# Patient Record
Sex: Female | Born: 1970
Health system: Southern US, Community
[De-identification: ages and names within clinical notes are randomized; demographics above are authoritative.]

## PROBLEM LIST (undated history)

## (undated) DIAGNOSIS — D649 Anemia, unspecified: Secondary | ICD-10-CM

## (undated) DIAGNOSIS — D500 Iron deficiency anemia secondary to blood loss (chronic): Secondary | ICD-10-CM

## (undated) DIAGNOSIS — K5190 Ulcerative colitis, unspecified, without complications: Secondary | ICD-10-CM

## (undated) HISTORY — DX: Iron deficiency anemia secondary to blood loss (chronic): D50.0

## (undated) HISTORY — PX: LAPAROSCOPIC GASTRIC BYPASS: SUR771

## (undated) HISTORY — DX: Ulcerative colitis, unspecified, without complications: K51.90

## (undated) HISTORY — DX: Anemia, unspecified: D64.9

---

## 2009-06-29 ENCOUNTER — Ambulatory Visit: Payer: Self-pay | Admitting: Cardiology

## 2011-05-02 ENCOUNTER — Ambulatory Visit: Payer: Self-pay | Admitting: Gastroenterology

## 2013-03-07 ENCOUNTER — Emergency Department: Payer: Self-pay | Admitting: Emergency Medicine

## 2013-03-07 LAB — URINALYSIS, COMPLETE
Bacteria: NONE SEEN
Bilirubin,UR: NEGATIVE
Glucose,UR: NEGATIVE mg/dL (ref 0–75)
Ketone: NEGATIVE
Nitrite: NEGATIVE
Ph: 6 (ref 4.5–8.0)
Protein: 30
RBC,UR: 1 /HPF (ref 0–5)
Specific Gravity: 1.026 (ref 1.003–1.030)
Squamous Epithelial: 2
WBC UR: 1 /HPF (ref 0–5)

## 2013-03-07 LAB — COMPREHENSIVE METABOLIC PANEL
Albumin: 3.4 g/dL (ref 3.4–5.0)
Alkaline Phosphatase: 82 U/L (ref 50–136)
Anion Gap: 4 — ABNORMAL LOW (ref 7–16)
BUN: 10 mg/dL (ref 7–18)
Bilirubin,Total: 0.2 mg/dL (ref 0.2–1.0)
Calcium, Total: 8.7 mg/dL (ref 8.5–10.1)
Chloride: 108 mmol/L — ABNORMAL HIGH (ref 98–107)
Co2: 27 mmol/L (ref 21–32)
Creatinine: 0.83 mg/dL (ref 0.60–1.30)
EGFR (African American): >60
EGFR (Non-African Amer.): >60
Glucose: 100 mg/dL — ABNORMAL HIGH (ref 65–99)
Osmolality: 277 (ref 275–301)
Potassium: 3.7 mmol/L (ref 3.5–5.1)
SGOT(AST): 24 U/L (ref 15–37)
SGPT (ALT): 16 U/L (ref 12–78)
Sodium: 139 mmol/L (ref 136–145)
Total Protein: 8.6 g/dL — ABNORMAL HIGH (ref 6.4–8.2)

## 2013-03-07 LAB — CBC
HCT: 27.9 % — ABNORMAL LOW (ref 35.0–47.0)
HGB: 8.6 g/dL — ABNORMAL LOW (ref 12.0–16.0)
MCH: 20 pg — ABNORMAL LOW (ref 26.0–34.0)
MCHC: 30.8 g/dL — ABNORMAL LOW (ref 32.0–36.0)
MCV: 65 fL — ABNORMAL LOW (ref 80–100)
Platelet: 299 10*3/uL (ref 150–440)
RBC: 4.3 10*6/uL (ref 3.80–5.20)
RDW: 20.7 % — ABNORMAL HIGH (ref 11.5–14.5)
WBC: 4.2 10*3/uL (ref 3.6–11.0)

## 2013-12-09 ENCOUNTER — Ambulatory Visit: Payer: Self-pay | Admitting: Gastroenterology

## 2013-12-31 ENCOUNTER — Encounter (INDEPENDENT_AMBULATORY_CARE_PROVIDER_SITE_OTHER): Payer: Self-pay

## 2013-12-31 ENCOUNTER — Ambulatory Visit (INDEPENDENT_AMBULATORY_CARE_PROVIDER_SITE_OTHER): Payer: BC Managed Care – PPO | Admitting: Gastroenterology

## 2013-12-31 ENCOUNTER — Other Ambulatory Visit: Payer: Self-pay | Admitting: *Deleted

## 2013-12-31 ENCOUNTER — Encounter: Payer: Self-pay | Admitting: Gastroenterology

## 2013-12-31 VITALS — BP 126/79 | HR 70 | Temp 97.0°F | Resp 18 | Ht 64.5 in | Wt 208.4 lb

## 2013-12-31 DIAGNOSIS — D500 Iron deficiency anemia secondary to blood loss (chronic): Secondary | ICD-10-CM

## 2013-12-31 DIAGNOSIS — K219 Gastro-esophageal reflux disease without esophagitis: Secondary | ICD-10-CM

## 2013-12-31 DIAGNOSIS — D649 Anemia, unspecified: Secondary | ICD-10-CM

## 2013-12-31 DIAGNOSIS — R109 Unspecified abdominal pain: Secondary | ICD-10-CM

## 2013-12-31 HISTORY — DX: Iron deficiency anemia secondary to blood loss (chronic): D50.0

## 2013-12-31 LAB — CBC WITH DIFFERENTIAL/PLATELET
Basophils Absolute: 0 10*3/uL (ref 0.0–0.1)
Basophils Relative: 0 % (ref 0–1)
Eosinophils Absolute: 0 10*3/uL (ref 0.0–0.7)
Eosinophils Relative: 1 % (ref 0–5)
HCT: 30.5 % — ABNORMAL LOW (ref 36.0–46.0)
Hemoglobin: 9.1 g/dL — ABNORMAL LOW (ref 12.0–15.0)
Lymphocytes Relative: 37 % (ref 12–46)
Lymphs Abs: 1.7 10*3/uL (ref 0.7–4.0)
MCH: 20.3 pg — ABNORMAL LOW (ref 26.0–34.0)
MCHC: 29.8 g/dL — ABNORMAL LOW (ref 30.0–36.0)
MCV: 67.9 fL — ABNORMAL LOW (ref 78.0–100.0)
Monocytes Absolute: 0.7 10*3/uL (ref 0.1–1.0)
Monocytes Relative: 16 % — ABNORMAL HIGH (ref 3–12)
Neutro Abs: 2.1 10*3/uL (ref 1.7–7.7)
Neutrophils Relative %: 46 % (ref 43–77)
Platelets: 315 10*3/uL (ref 150–400)
RBC: 4.49 MIL/uL (ref 3.87–5.11)
RDW: 19.9 % — ABNORMAL HIGH (ref 11.5–15.5)
WBC: 4.5 10*3/uL (ref 4.0–10.5)

## 2013-12-31 LAB — IRON: Iron: 23 ug/dL — ABNORMAL LOW (ref 42–145)

## 2013-12-31 MED ORDER — LINACLOTIDE 145 MCG PO CAPS: 145.0000 ug | ORAL_CAPSULE | Freq: Every day | ORAL | Status: DC

## 2013-12-31 MED ORDER — OMEPRAZOLE 20 MG PO CPDR: 20.0000 mg | DELAYED_RELEASE_CAPSULE | Freq: Every day | ORAL | Status: DC

## 2013-12-31 MED ORDER — PEG 3350-KCL-NA BICARB-NACL 420 G PO SOLR: 4000.0000 mL | ORAL | Status: DC

## 2013-12-31 NOTE — Patient Instructions (Signed)
Please complete blood work. We will call with the results.  For reflux: start taking Prilosec once daily, 30 minutes before breakfast.  For constipation: a voucher has been provided for Linzess 145 mcg that you can take each morning, 30 minutes prior to breakfast.   We have scheduled a colonoscopy and possible upper endoscopy in the near future with Dr. Darrick Penna.

## 2013-12-31 NOTE — Assessment & Plan Note (Signed)
43 year old female with history of possible ulcerative colitis but denies any prior colonoscopy, diagnosed in very remote past by Dr. Karilyn Cota. Outside records not available at time of visit but have been requested; previously on Asacol per report but hasn't taken in years. Intermittent LLQ discomfort for the last few months but without acute pain at time of visit; some improvement with defecation, and without any concerning signs at time of visit. Low-volume hematochezia but no significant rectal bleeding, diarrhea, or severe abdominal pain currently.  Needs better management of constipation: start Linzess 145 mcg daily Obtain outside records Proceed with colonoscopy with Dr. Darrick Penna in the near future. The risks, benefits, and alternatives have been discussed in detail with the patient. They state understanding and desire to proceed.

## 2013-12-31 NOTE — Progress Notes (Signed)
Primary Care Physician:  No PCP Per Patient Primary Gastroenterologist:  Dr. Darrick Penna   Chief Complaint  Patient presents with  . Ulcerative Colitis    Right Side Pain  . Constipation    HPI:   Candice Ross presents today as a self-referral secondary to self-reported history of ulcerative colitis. Was on Asacol in remote past. Saw Dr. Karilyn Cota in [place] in the remote past. Denies any prior colonoscopy. It is unclear when she was last seen by any GI specialist. Somewhat vague regarding dates and prior work-up.   Notes pain in LLQ, intermittent. Waxes and wanes in intensity. Now better. Onset about 4-5 months ago. Had chills associated with it. Mild hematochezia. Notes chronic constipation. No diarrhea. States will take awhile to have a BM but ok the last few days. Sometimes pain improved after defecation. Mild nausea, doesn't eat very much, chronic, since gastric bypass. No significant changes in weight. No dysphagia. Has pain after eating in LLQ. Notes reflux. Pain will run for a few weeks then ease up.   Was placed on iron, Vit D, and more she can't remember. Stays fatigued. Last blood work about a year ago. Pre-op weight in the 400 range, lowest 150. Now stable in low 200 range.   States she is taking iron, Vitamin D, and some other medications but does not know the specific names or dosages. Pharmacy was contacted, with no prescriptions filled since early 2014. She also tells me that her Hgb was 7 about a year ago. They are "watching it".    Past Medical History  Diagnosis Date  . Ulcerative colitis     POSSIBLE  . Anemia     Past Surgical History  Procedure Laterality Date  . [place] bypass      [place]    No current outpatient prescriptions on file.   No current facility-administered medications for this visit.    Allergies as of 12/31/2013  . (Not on File)    Family History  Problem Relation Age of Onset  . Colon cancer Neg Hx      History   Social History  . Marital Status: Divorced    Spouse Name: N/A    Number of Children: N/A  . 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 Smoking status: Never Smoker   . Smokeless tobacco: Not on file  . Alcohol Use: No  . Drug Use: No  . Sexual Activity: Yes    Birth Control/ Protection: IUD   Other Topics Concern  . Not on file   Social History Narrative  . No narrative on file    Review of Systems: Gen: see HPI CV: Denies chest pain, heart palpitations, peripheral edema, syncope.  Resp: Denies shortness of breath at rest or with exertion. Denies wheezing or cough.  GI: see HPI GU : Denies urinary burning, urinary frequency, urinary hesitancy MS: states legs hurt at night Derm: Denies rash, itching, dry skin Psych: Denies depression, anxiety, memory loss, and confusion Heme: Denies bruising, bleeding, and enlarged lymph nodes.  Physical Exam: BP 126/79  Pulse 70  Temp(Src) 97 F (36.1 C) (Oral)  Resp 18  Ht 5' 4.5" (1.638 m)  Wt 208 lb 6.4 oz (94.53 kg)  BMI 35.23 kg/m2 General:   Alert and oriented. Pleasant and cooperative. Well-nourished and well-developed.  Head:  Normocephalic and atraumatic. Eyes:  Without icterus, sclera clear and conjunctiva pink.  Ears:  Normal auditory acuity. Nose:  No deformity, discharge,  or lesions. Mouth:  No deformity or lesions, oral mucosa pink.  Lungs:  Clear to auscultation bilaterally. No wheezes, rales, or rhonchi. No distress.  Heart:  S1, S2 present without murmurs appreciated.  Abdomen:  +BS, soft, non-tender and non-distended. No HSM noted. No guarding or rebound. No masses appreciated.  Rectal:  Deferred  Msk:  Symmetrical without gross deformities. Normal posture. Extremities:  Without clubbing or edema. Neurologic:  Alert and  oriented x4;  grossly normal neurologically. Skin:  Intact without significant lesions or  rashes. Psych:  Alert and cooperative. Normal mood and affect.

## 2013-12-31 NOTE — Assessment & Plan Note (Signed)
Per patient's report, Hgb "7" a year ago. Need updated CBC, iron, ferritin. Likely multifactorial with known hx of gastric bypass procedure in 2006. Check Vit D, B12 as well due to malabsorptive state. If evidence of IDA, consider EGD at time of colonoscopy; however it would likely be secondary to post-surgical state.   Proceed with possible upper endoscopy in the near future with Dr. Darrick Penna. The risks, benefits, and alternatives have been discussed in detail with patient. They have stated understanding and desire to proceed.

## 2014-01-01 ENCOUNTER — Encounter (HOSPITAL_COMMUNITY): Payer: Self-pay | Admitting: Pharmacy Technician

## 2014-01-01 LAB — VITAMIN B12: Vitamin B-12: 301 pg/mL (ref 211–911)

## 2014-01-01 LAB — VITAMIN D 25 HYDROXY (VIT D DEFICIENCY, FRACTURES): Vit D, 25-Hydroxy: 41 ng/mL (ref 30–89)

## 2014-01-01 LAB — FERRITIN: Ferritin: 4 ng/mL — ABNORMAL LOW (ref 10–291)

## 2014-01-07 NOTE — Progress Notes (Signed)
NO PCP

## 2014-01-13 ENCOUNTER — Encounter (HOSPITAL_COMMUNITY): Payer: Self-pay | Admitting: *Deleted

## 2014-01-13 ENCOUNTER — Other Ambulatory Visit: Payer: Self-pay | Admitting: Gastroenterology

## 2014-01-13 ENCOUNTER — Encounter (HOSPITAL_COMMUNITY)
Admission: RE | Disposition: A | Discharge status: Home / Self Care | Payer: Self-pay | Source: Ambulatory Visit | Attending: Gastroenterology

## 2014-01-13 ENCOUNTER — Ambulatory Visit (HOSPITAL_COMMUNITY)
Admission: RE | Admit: 2014-01-13 | Discharge: 2014-01-13 | Disposition: A | Discharge status: Home / Self Care | Payer: BC Managed Care – PPO | Source: Ambulatory Visit | Attending: Gastroenterology | Admitting: Gastroenterology

## 2014-01-13 DIAGNOSIS — K644 Residual hemorrhoidal skin tags: Secondary | ICD-10-CM | POA: Diagnosis not present

## 2014-01-13 DIAGNOSIS — R1012 Left upper quadrant pain: Secondary | ICD-10-CM

## 2014-01-13 DIAGNOSIS — K259 Gastric ulcer, unspecified as acute or chronic, without hemorrhage or perforation: Secondary | ICD-10-CM | POA: Diagnosis not present

## 2014-01-13 DIAGNOSIS — K219 Gastro-esophageal reflux disease without esophagitis: Secondary | ICD-10-CM

## 2014-01-13 DIAGNOSIS — Q438 Other specified congenital malformations of intestine: Secondary | ICD-10-CM | POA: Insufficient documentation

## 2014-01-13 DIAGNOSIS — D539 Nutritional anemia, unspecified: Secondary | ICD-10-CM

## 2014-01-13 DIAGNOSIS — K2940 Chronic atrophic gastritis without bleeding: Secondary | ICD-10-CM | POA: Insufficient documentation

## 2014-01-13 DIAGNOSIS — R109 Unspecified abdominal pain: Secondary | ICD-10-CM

## 2014-01-13 DIAGNOSIS — D509 Iron deficiency anemia, unspecified: Secondary | ICD-10-CM | POA: Insufficient documentation

## 2014-01-13 DIAGNOSIS — Z6835 Body mass index (BMI) 35.0-35.9, adult: Secondary | ICD-10-CM | POA: Diagnosis not present

## 2014-01-13 DIAGNOSIS — R1032 Left lower quadrant pain: Secondary | ICD-10-CM

## 2014-01-13 DIAGNOSIS — D649 Anemia, unspecified: Secondary | ICD-10-CM

## 2014-01-13 HISTORY — PX: COLONOSCOPY: SHX5424

## 2014-01-13 HISTORY — PX: ESOPHAGOGASTRODUODENOSCOPY: SHX5428

## 2014-01-13 SURGERY — COLONOSCOPY
Anesthesia: Moderate Sedation

## 2014-01-13 MED ORDER — SODIUM CHLORIDE 0.9 % IV SOLN
INTRAVENOUS | Status: DC
  Administered 2014-01-13: 10:00:00 via INTRAVENOUS

## 2014-01-13 MED ORDER — LIDOCAINE VISCOUS 2 % MT SOLN
OROMUCOSAL | Status: AC
  Filled 2014-01-13: qty 15

## 2014-01-13 MED ORDER — LIDOCAINE VISCOUS 2 % MT SOLN
OROMUCOSAL | Status: DC | PRN
  Administered 2014-01-13: 2 mL via OROMUCOSAL

## 2014-01-13 MED ORDER — MEPERIDINE HCL 100 MG/ML IJ SOLN
INTRAMUSCULAR | Status: DC | PRN
  Administered 2014-01-13 (×4): 25 mg via INTRAVENOUS

## 2014-01-13 MED ORDER — MIDAZOLAM HCL 5 MG/5ML IJ SOLN
INTRAMUSCULAR | Status: AC
  Filled 2014-01-13: qty 10

## 2014-01-13 MED ORDER — STERILE WATER FOR IRRIGATION IR SOLN
Status: DC | PRN
  Administered 2014-01-13: 10:00:00

## 2014-01-13 MED ORDER — MIDAZOLAM HCL 5 MG/5ML IJ SOLN
INTRAMUSCULAR | Status: DC | PRN
  Administered 2014-01-13 (×2): 1 mg via INTRAVENOUS
  Administered 2014-01-13 (×3): 2 mg via INTRAVENOUS
  Administered 2014-01-13: 1 mg via INTRAVENOUS

## 2014-01-13 MED ORDER — MEPERIDINE HCL 100 MG/ML IJ SOLN
INTRAMUSCULAR | Status: AC
  Filled 2014-01-13: qty 2

## 2014-01-13 NOTE — Discharge Instructions (Signed)
YOUR ABDOMINAL PAIN IS DUE TO GASTRITIS/CONSTIPATION. YOU HAVE A SMALL ULCER AT YOUR STAPLE LINE. You have EXTERNALhemorrhoids AND STOMACH POLYPS.  I BIOPSIED YOUR STOMACH, COLON, AND RECTUM.    SEE HEMATOLOGY FOR IV IRON.  FOLLOW A LOW FAT/HIGH FIBER DIET. AVOID ITEMS THAT CAUSE BLOATING. SEE INFO BELOW.  START OMEPRAZOLE.  TAKE 30 MINUTES PRIOR TO YOUR FIRST MEAL.  CONTINUE LINZESS. TAKE 30 MINS PRIOR TO FOOD AND IF NO BM EVERY OTHER DAY THEN TAKE WITH FOOD.  AVOID ITEMS THAT TRIGGER GASTRITIS. SEE INFO BELOW  YOUR BIOPSY WILL BE BACK IN 7 DAYS.  FOLLOW UP IN SEP 2015.  NEXT COLONOSCOPY IN 10 YEAR.   ENDOSCOPY Care After Read the instructions outlined below and refer to this sheet in the next week. These discharge instructions provide you with general information on caring for yourself after you leave the hospital. While your treatment has been planned according to the most current medical practices available, unavoidable complications occasionally occur. If you have any problems or questions after discharge, call DR. Josefa Syracuse, [phone].  ACTIVITY  You may resume your regular activity, but move at a slower pace for the next 24 hours.   Take frequent rest periods for the next 24 hours.   Walking will help get rid of the air and reduce the bloated feeling in your belly (abdomen).   No driving for 24 hours (because of the medicine (anesthesia) used during the test).   You may shower.   Do not sign any important legal documents or operate any machinery for 24 hours (because of the anesthesia used during the test).    NUTRITION  Drink plenty of fluids.   You may resume your normal diet as instructed by your doctor.   Begin with a light meal and progress to your normal diet. Heavy or fried foods are harder to digest and may make you feel sick to your stomach (nauseated).   Avoid alcoholic beverages for 24 hours or as instructed.    MEDICATIONS  You may resume your  normal medications.   WHAT YOU CAN EXPECT TODAY  Some feelings of bloating in the abdomen.   Passage of more gas than usual.   Spotting of blood in your stool or on the toilet paper  .  IF YOU HAD POLYPS REMOVED DURING THE ENDOSCOPY:  Eat a soft diet IF YOU HAVE NAUSEA, BLOATING, ABDOMINAL PAIN, OR VOMITING.    FINDING OUT THE RESULTS OF YOUR TEST Not all test results are available during your visit. DR. Darrick Penna WILL CALL YOU WITHIN 7 DAYS OF YOUR PROCEDUE WITH YOUR RESULTS. Do not assume everything is normal if you have not heard from DR. Freeman Borba IN ONE WEEK, CALL HER OFFICE AT [phone].  SEEK IMMEDIATE MEDICAL ATTENTION AND CALL THE OFFICE: [phone] IF:  You have more than a spotting of blood in your stool.   Your belly is swollen (abdominal distention).   You are nauseated or vomiting.   You have a temperature over 101F.   You have abdominal pain or discomfort that is severe or gets worse throughout the day.   Gastritis  Gastritis is an inflammation (the body's way of reacting to injury and/or infection) of the stomach. It is often caused by bacterial (germ) infections. It can also be caused BY ASPIRIN, BC/GOODY POWDER'S, (IBUPROFEN) MOTRIN, OR ALEVE (NAPROXEN), chemicals (including alcohol), SPICY FOODS, and medications. This illness may be associated with generalized malaise (feeling tired, not well), UPPER ABDOMINAL STOMACH cramps, and fever. One  common bacterial cause of gastritis is an organism known as H. Pylori. This can be treated with antibiotics.    High-Fiber Diet A high-fiber diet changes your normal diet to include more whole grains, legumes, fruits, and vegetables. Changes in the diet involve replacing refined carbohydrates with unrefined foods. The calorie level of the diet is essentially unchanged. The Dietary Reference Intake (recommended amount) for adult males is 38 grams per day. For adult females, it is 25 grams per day. Pregnant and lactating  women should consume 28 grams of fiber per day. Fiber is the intact part of a plant that is not broken down during digestion. Functional fiber is fiber that has been isolated from the plant to provide a beneficial effect in the body. PURPOSE  Increase stool bulk.   Ease and regulate bowel movements.   Lower cholesterol.  INDICATIONS THAT YOU NEED MORE FIBER  Constipation and hemorrhoids.   Uncomplicated diverticulosis (intestine condition) and irritable bowel syndrome.   Weight management.   As a protective measure against hardening of the arteries (atherosclerosis), diabetes, and cancer.   DO NOT USE WITH:  Acute diverticulitis (intestine infection).   Partial small bowel obstructions.   Complicated diverticular disease involving bleeding, rupture (perforation), or abscess (boil, furuncle).   Presence of autonomic neuropathy (nerve damage) or gastroparesis (stomach cannot empty itself).    GUIDELINES FOR INCREASING FIBER IN THE DIET  Start adding fiber to the diet slowly. A gradual increase of about 5 more grams (2 slices of whole-wheat bread, 2 servings of most fruits or vegetables, or 1 bowl of high-fiber cereal) per day is best. Too rapid an increase in fiber may result in constipation, flatulence, and bloating.   Drink enough water and fluids to keep your urine clear or pale yellow. Water, juice, or caffeine-free drinks are recommended. Not drinking enough fluid may cause constipation.   Eat a variety of high-fiber foods rather than one type of fiber.   Try to increase your intake of fiber through using high-fiber foods rather than fiber pills or supplements that contain small amounts of fiber.   The goal is to change the types of food eaten. Do not supplement your present diet with high-fiber foods, but replace foods in your present diet.    INCLUDE A VARIETY OF FIBER SOURCES  Replace refined and processed grains with whole grains, canned fruits with fresh fruits,  and incorporate other fiber sources. White rice, white breads, and most bakery goods contain little or no fiber.   Brown whole-grain rice, buckwheat oats, and many fruits and vegetables are all good sources of fiber. These include: broccoli, Brussels sprouts, cabbage, cauliflower, beets, sweet potatoes, white potatoes (skin on), carrots, tomatoes, eggplant, squash, berries, fresh fruits, and dried fruits.   Cereals appear to be the richest source of fiber. Cereal fiber is found in whole grains and bran. Bran is the fiber-rich outer coat of cereal grain, which is largely removed in refining. In whole-grain cereals, the bran remains. In breakfast cereals, the largest amount of fiber is found in those with "bran" in their names. The fiber content is sometimes indicated on the label.   You may need to include additional fruits and vegetables each day.   In baking, for 1 cup white flour, you may use the following substitutions:   1 cup whole-wheat flour minus 2 tablespoons.   1/2 cup white flour plus 1/2 cup whole-wheat flour.   Low-Fat Diet BREADS, CEREALS, PASTA, RICE, DRIED PEAS, AND BEANS These products are  high in carbohydrates and most are low in fat. Therefore, they can be increased in the diet as substitutes for fatty foods. They too, however, contain calories and should not be eaten in excess. Cereals can be eaten for snacks as well as for breakfast.  Include foods that contain fiber (fruits, vegetables, whole grains, and legumes). Research shows that fiber may lower blood cholesterol levels, especially the water-soluble fiber found in fruits, vegetables, oat products, and legumes. FRUITS AND VEGETABLES It is good to eat fruits and vegetables. Besides being sources of fiber, both are rich in vitamins and some minerals. They help you get the daily allowances of these nutrients. Fruits and vegetables can be used for snacks and desserts. MEATS Limit lean meat, chicken, [place], and fish to no  more than 6 ounces per day. Beef, Pork, and Lamb Use lean cuts of beef, pork, and lamb. Lean cuts include:  Extra-lean ground beef.  Arm roast.  Sirloin tip.  Center-cut ham.  Round steak.  Loin chops.  Rump roast.  Tenderloin.  Trim all fat off the outside of meats before cooking. It is not necessary to severely decrease the intake of red meat, but lean choices should be made. Lean meat is rich in protein and contains a highly absorbable form of iron. Premenopausal women, in particular, should avoid reducing lean red meat because this could increase the risk for low red blood cells (iron-deficiency anemia). The organ meats, such as liver, sweetbreads, kidneys, and brain are very rich in cholesterol. They should be limited. Chicken and [place] These are good sources of protein. The fat of poultry can be reduced by removing the skin and underlying fat layers before cooking. Chicken and [place] can be substituted for lean red meat in the diet. Poultry should not be fried or covered with high-fat sauces. Fish and Shellfish Fish is a good source of protein. Shellfish contain cholesterol, but they usually are low in saturated fatty acids. The preparation of fish is important. Like chicken and [place], they should not be fried or covered with high-fat sauces. EGGS Egg whites contain no fat or cholesterol. They can be eaten often. Try 1 to 2 egg whites instead of whole eggs in recipes or use egg substitutes that do not contain yolk. MILK AND DAIRY PRODUCTS Use skim or 1% milk instead of 2% or whole milk. Decrease whole milk, natural, and processed cheeses. Use nonfat or low-fat (2%) cottage cheese or low-fat cheeses made from vegetable oils. Choose nonfat or low-fat (1 to 2%) yogurt. Experiment with evaporated skim milk in recipes that call for heavy cream. Substitute low-fat yogurt or low-fat cottage cheese for sour cream in dips and salad dressings. Have at least 2 servings of low-fat dairy products,  such as 2 glasses of skim (or 1%) milk each day to help get your daily calcium intake.  FATS AND OILS Reduce the total intake of fats, especially saturated fat. Butterfat, lard, and beef fats are high in saturated fat and cholesterol. These should be avoided as much as possible. Vegetable fats do not contain cholesterol, but certain vegetable fats, such as coconut oil, palm oil, and palm kernel oil are very high in saturated fats. These should be limited. These fats are often used in bakery goods, processed foods, popcorn, oils, and nondairy creamers. Vegetable shortenings and some peanut butters contain hydrogenated oils, which are also saturated fats. Read the labels on these foods and check for saturated vegetable oils. Unsaturated vegetable oils and fats do not raise blood  cholesterol. However, they should be limited because they are fats and are high in calories. Total fat should still be limited to 30% of your daily caloric intake. Desirable liquid vegetable oils are corn oil, cottonseed oil, olive oil, canola oil, safflower oil, soybean oil, and sunflower oil. Peanut oil is not as good, but small amounts are acceptable. Buy a heart-healthy tub margarine that has no partially hydrogenated oils in the ingredients. Mayonnaise and salad dressings often are made from unsaturated fats, but they should also be limited because of their high calorie and fat content. Seeds, nuts, peanut butter, olives, and avocados are high in fat, but the fat is mainly the unsaturated type. These foods should be limited mainly to avoid excess calories and fat. OTHER EATING TIPS Snacks  Most sweets should be limited as snacks. They tend to be rich in calories and fats, and their caloric content outweighs their nutritional value. Some good choices in snacks are graham crackers, melba toast, soda crackers, bagels (no egg), English muffins, fruits, and vegetables. These snacks are preferable to snack crackers, [place] fries, and  chips. Popcorn should be air-popped or cooked in small amounts of liquid vegetable oil. Desserts Eat fruit, low-fat yogurt, and fruit ices. AVOID pastries, cake, and cookies. Sherbet, angel food cake, gelatin dessert, frozen low-fat yogurt, or other frozen products that do not contain saturated fat (pure fruit juice bars, frozen ice pops) are also acceptable.  COOKING METHODS Choose those methods that use little or no fat. They include: Poaching.  Braising.  Steaming.  Grilling.  Baking.  Stir-frying.  Broiling.  Microwaving.  Foods can be cooked in a nonstick pan without added fat, or use a nonfat cooking spray in regular cookware. Limit fried foods and avoid frying in saturated fat. Add moisture to lean meats by using water, broth, cooking wines, and other nonfat or low-fat sauces along with the cooking methods mentioned above. Soups and stews should be chilled after cooking. The fat that forms on top after a few hours in the refrigerator should be skimmed off. When preparing meals, avoid using excess salt. Salt can contribute to raising blood pressure in some people. EATING AWAY FROM HOME Order entres, potatoes, and vegetables without sauces or butter. When meat exceeds the size of a deck of cards (3 to 4 ounces), the rest can be taken home for another meal. Choose vegetable or fruit salads and ask for low-calorie salad dressings to be served on the side. Use dressings sparingly. Limit high-fat toppings, such as bacon, crumbled eggs, cheese, sunflower seeds, and olives. Ask for heart-healthy tub margarine instead of butter.  Hemorrhoids Hemorrhoids are dilated (enlarged) veins around the rectum. Sometimes clots will form in the veins. This makes them swollen and painful. These are called thrombosed hemorrhoids. Causes of hemorrhoids include:  Constipation.   Straining to have a bowel movement.   HEAVY LIFTING HOME CARE INSTRUCTIONS  Eat a well balanced diet and drink 6 to 8 glasses  of water every day to avoid constipation. You may also use a bulk laxative.   Avoid straining to have bowel movements.   Keep anal area dry and clean.   Do not use a donut shaped pillow or sit on the toilet for long periods. This increases blood pooling and pain.   Move your bowels when your body has the urge; this will require less straining and will decrease pain and pressure. =

## 2014-01-13 NOTE — Op Note (Signed)
[facility] [address] [place], [id number]   ENDOSCOPY PROCEDURE REPORT  PATIENT: Candice Ross, Candice C.  MR#: [id number] BIRTHDATE: March 31, 1971 , 43  yrs. old GENDER: Female  ENDOSCOPIST: Jonette EvaSandi Fields, MD REFERRED BY:  PROCEDURE DATE: 01/13/2014 PROCEDURE:   EGD w/ biopsy INDICATIONS:abdominal pain in upper left quadrant.   abdominal pain in the lower left quadrant.   Iron deficiency anemia. MEDICATIONS: TCS+ Demerol 25 mg IV and Versed 2 mg IV TOPICAL ANESTHETIC:   Viscous Xylocaine  DESCRIPTION OF PROCEDURE:     Physical exam was performed.  Informed consent was obtained from the patient after explaining the benefits, risks, and alternatives to the procedure.  The patient was connected to the monitor and placed in the left lateral position.  Continuous oxygen was provided by nasal cannula and IV medicine administered through an indwelling cannula.  After administration of sedation, the patients esophagus was intubated and the [id number] ([id number]  endoscope was advanced under direct visualization to the second portion of the duodenum.  The scope was removed slowly by carefully examining the color, texture, anatomy, and integrity of the mucosa on the way out.  The patient was recovered in endoscopy and discharged home in satisfactory condition.   ESOPHAGUS: The mucosa of the esophagus appeared normal.   STOMACH: Non-erosive gastritis (inflammation) was found in the gastric fundus.  Multiple biopsies were performed using cold forceps. Multiple sessile polyps measuring 3-6 mm in size were found in the gastric fundus.  Multiple biopsies was performed using cold forceps.   A small clean-based ulcer with surrounding edema was found at the anastomosis.  Biopsies were taken at the center of the ulcer(2) and at edge of the ulcer(6).   NORMAL SMALL BOWEL. COMPLICATIONS:   None  ENDOSCOPIC IMPRESSION: 1.   LUQ/LLQ PAIN MOST LIKELY DUE TO GASTRITIS/CONSTIPATION. 2.    MILD Non-erosive gastritis 3.   Multiple SMALL GASTRIC polyps 4.   Small ulcer at the anastomosis MOST LIKELY DUE TO ISCHEMIA 5.   PT LIKELY HAD A ROUX-en-Y GASTRO JEJUNOSTOMY  RECOMMENDATIONS: SEE HEMATOLOGY FOR IV IRON. FOLLOW A LOW FAT/HIGH FIBER DIET.  AVOID ITEMS THAT CAUSE BLOATING.  START OMEPRAZOLE.  TAKE 30 MINUTES PRIOR TO YOUR FIRST MEAL. CONTINUE LINZESS.  TAKE 30 MINS PRIOR TO FOOD AND IF NO BM EVERY OTHER DAY THEN TAKE WITH FOOD. AVOID ITEMS THAT TRIGGER GASTRITIS. BIOPSY WILL BE BACK IN 7 DAYS.  FOLLOW UP IN SEP 2015.   REPEAT EXAM:   _______________________________ Rosalie DoctoreSignedJonette Eva:  Sandi Fields, MD 01/13/2014 3:11 PM Instructions Given]

## 2014-01-13 NOTE — Op Note (Signed)
[facility] [address] [place], [id number]   COLONOSCOPY PROCEDURE REPORT  PATIENT: Candice Ross, Candice C.  MR#: [id number] BIRTHDATE: 1971-01-13 , 43  yrs. old GENDER: Female ENDOSCOPIST: Jonette EvaSandi Fields, MD REFERRED BY: PROCEDURE DATE:  01/13/2014 PROCEDURE:   Colonoscopy with biopsy INDICATIONS:abdominal pain in the lower left quadrant and abdominal pain in the upper left quadrant & CONSTIPATION, IRON DEFICIENCY ANEMIA-FERRITIN 4, PSHx: GABS MEDICATIONS: Demerol 75 mg IV and Versed 7 mg IV  DESCRIPTION OF PROCEDURE:    Physical exam was performed.  Informed consent was obtained from the patient after explaining the benefits, risks, and alternatives to procedure.  The patient was connected to monitor and placed in left lateral position. Continuous oxygen was provided by nasal cannula and IV medicine administered through an indwelling cannula.  After administration of sedation and rectal exam, the patients rectum was intubated and the EC-[id number] ([id number]  colonoscope was advanced under direct visualization to the ileum.  The scope was removed slowly by carefully examining the color, texture, anatomy, and integrity mucosa on the way out.  The patient was recovered in endoscopy and discharged home in satisfactory condition.       COLON FINDINGS: The mucosa appeared normal in the terminal ileum.  , A normal appearing cecum, ileocecal valve, and appendiceal orifice were identified.  The ascending, hepatic flexure, transverse, splenic flexure, descending, sigmoid colon and rectum appeared unremarkable.  No polyps or cancers were seen.  Multiple biopsies were performed TO EVALUATE FOR IBD. The mucosa appeared normal in the terminal ileum.  Multiple biopsies were performed TO EVALUATE FOR IBD.  , Moderate sized external hemorrhoids were found.  , and The colon was redundant.  Manual abdominal counter-pressure was used to reach the cecum.  PREP QUALITY: good.  CECAL  W/D TIME: 11 minutes     COMPLICATIONS: None  ENDOSCOPIC IMPRESSION: 1.   Normal mucosa in the terminal ileum 2.   Normal colon EXCEPT L COLON IS REDUNDANT 3.   Moderate sized external hemorrhoids 4.   NO SOURCE FOR ABDOMINAL PAIN OR ANEMIA IDENTIFIED  RECOMMENDATIONS: AWAIT BIOPSY HIGH FIBER DIET DRINK WATER ADD LINZESS OPV SEP 2015       _______________________________ Rosalie DoctoreSignedJonette Eva:  Sandi Fields, MD 01/13/2014 10:43 AM     PATIENT NAME:  Candice Ross, Candice C. MR#: [id number]

## 2014-01-13 NOTE — Progress Notes (Addendum)
REVIEWED. Pt needs hematology referral FOR IV IRON, DX: GASTRIC BYPASS/IRON DEFICIENCY ANEMIA(FERRITIN 4).

## 2014-01-13 NOTE — Progress Notes (Signed)
I have faxed order over to Short Stay

## 2014-01-13 NOTE — H&P (Signed)
  Primary Care Physician:  No PCP Per Patient Primary Gastroenterologist:  Dr. Darrick PennaFields  Pre-Procedure History & Physical: HPI:  Candice ReamerLinda Carol Peatross Ross is a 43 y.o. female [facility].  Past Medical History  Diagnosis Date  . Ulcerative colitis     POSSIBLE  . Anemia     Past Surgical History  Procedure Laterality Date  . Laparoscopic gastric bypass      [place]    Prior to Admission medications   Medication Sig Start Date End Date Taking? Authorizing Provider  Linaclotide (LINZESS) 145 MCG CAPS capsule Take 1 capsule (145 mcg total) by mouth daily. 30 minutes before breakfast 12/31/13  Yes Nira RetortAnna W Sams, NP  omeprazole (PRILOSEC) 20 MG capsule Take 1 capsule (20 mg total) by mouth daily. 30 minutes prior to breakfast for reflux. 12/31/13  Yes Nira RetortAnna W Sams, NP  polyethylene glycol-electrolytes (TRILYTE) 420 G solution Take 4,000 mLs by mouth as directed. 12/31/13  Yes West BaliSandi L Fields, MD    Allergies as of 12/31/2013  . (Not on File)    Family History  Problem Relation Age of Onset  . Colon cancer Neg Hx     History   Social History  . Marital Status: Divorced    Spouse Name: N/A    Number of Children: N/A  . Years of Education: N/A   Occupational History  . Caterer   . Unit Director for [employer]    Social History Main Topics  . Smoking status: Never Smoker   . Smokeless tobacco: Not on file  . Alcohol Use: No  . Drug Use: No  . Sexual Activity: Yes    Birth Control/ Protection: IUD   Other Topics Concern  . Not on file   Social History Narrative  . No narrative on file    Review of Systems: See HPI, otherwise negative ROS   Physical Exam: BP 142/86  Pulse 62  Temp(Src) 98.3 F (36.8 C) (Oral)  Resp 20  Ht 5' 4.5" (1.638 m)  Wt 208 lb (94.348 kg)  BMI 35.16 kg/m2  SpO2 99%  LMP 12/18/2013 General:   Alert,  pleasant and cooperative in NAD Head:  Normocephalic and atraumatic. Neck:  Supple; Lungs:  Clear  throughout to auscultation.    Heart:  Regular rate and rhythm. Abdomen:  Soft, nontender and nondistended. Normal bowel sounds, without guarding, and without rebound.   Neurologic:  Alert and  oriented x4;  grossly normal neurologically.  Impression/Plan:   FEDAABDOMINAL PAIN/DYSPEPSIA.  PLAN: 1. TCS/EGD TODAY

## 2014-01-14 ENCOUNTER — Other Ambulatory Visit: Payer: Self-pay | Admitting: Gastroenterology

## 2014-01-14 DIAGNOSIS — D509 Iron deficiency anemia, unspecified: Secondary | ICD-10-CM

## 2014-01-15 NOTE — Progress Notes (Signed)
Quick Note:  Called and informed pt. ______ 

## 2014-01-15 NOTE — Progress Notes (Signed)
Quick Note:  Please let patient know that iron, ferritin are low. Hgb low. Will be seeing Hematology soon for consideration of IV iron.  Needs to be taking oral iron in the interim due to history of malabsorptive procedure. Take iron 325 mg po BID.  B 12 and Vit D are good. Make sure taking a complete multivitamin daily. Also recommended after gastric bypass: Calcium with Vit D (like Oscal, Caltrate) and sublingual Vit B 12, which both are over the counter.    ______

## 2014-01-17 ENCOUNTER — Encounter (HOSPITAL_COMMUNITY): Payer: Self-pay | Admitting: Gastroenterology

## 2014-01-31 ENCOUNTER — Encounter (HOSPITAL_COMMUNITY): Payer: BC Managed Care – PPO | Attending: Hematology and Oncology

## 2014-01-31 ENCOUNTER — Encounter (HOSPITAL_COMMUNITY): Payer: Self-pay

## 2014-01-31 VITALS — BP 109/58 | HR 75 | Temp 98.3°F | Resp 16 | Ht 64.5 in | Wt 204.6 lb

## 2014-01-31 DIAGNOSIS — K589 Irritable bowel syndrome without diarrhea: Secondary | ICD-10-CM | POA: Insufficient documentation

## 2014-01-31 DIAGNOSIS — K289 Gastrojejunal ulcer, unspecified as acute or chronic, without hemorrhage or perforation: Secondary | ICD-10-CM | POA: Insufficient documentation

## 2014-01-31 DIAGNOSIS — Z6834 Body mass index (BMI) 34.0-34.9, adult: Secondary | ICD-10-CM | POA: Insufficient documentation

## 2014-01-31 DIAGNOSIS — D509 Iron deficiency anemia, unspecified: Secondary | ICD-10-CM | POA: Insufficient documentation

## 2014-01-31 DIAGNOSIS — E538 Deficiency of other specified B group vitamins: Secondary | ICD-10-CM | POA: Insufficient documentation

## 2014-01-31 DIAGNOSIS — Z9884 Bariatric surgery status: Secondary | ICD-10-CM | POA: Insufficient documentation

## 2014-01-31 DIAGNOSIS — R319 Hematuria, unspecified: Secondary | ICD-10-CM

## 2014-01-31 DIAGNOSIS — E669 Obesity, unspecified: Secondary | ICD-10-CM | POA: Insufficient documentation

## 2014-01-31 DIAGNOSIS — R718 Other abnormality of red blood cells: Secondary | ICD-10-CM

## 2014-01-31 DIAGNOSIS — Z79899 Other long term (current) drug therapy: Secondary | ICD-10-CM | POA: Insufficient documentation

## 2014-01-31 DIAGNOSIS — K581 Irritable bowel syndrome with constipation: Secondary | ICD-10-CM

## 2014-01-31 LAB — URINE MICROSCOPIC-ADD ON

## 2014-01-31 LAB — COMPREHENSIVE METABOLIC PANEL
ALT: 9 U/L (ref 0–35)
AST: 20 U/L (ref 0–37)
Albumin: 3.6 g/dL (ref 3.5–5.2)
Alkaline Phosphatase: 60 U/L (ref 39–117)
Anion gap: 10 (ref 5–15)
BUN: 12 mg/dL (ref 6–23)
CO2: 24 mEq/L (ref 19–32)
Calcium: 9.1 mg/dL (ref 8.4–10.5)
Chloride: 107 mEq/L (ref 96–112)
Creatinine, Ser: 0.84 mg/dL (ref 0.50–1.10)
GFR calc Af Amer: >90 mL/min (ref >90)
GFR calc non Af Amer: 83 mL/min — ABNORMAL LOW (ref >90)
Glucose, Bld: 57 mg/dL — ABNORMAL LOW (ref 70–99)
Potassium: 3.8 mEq/L (ref 3.7–5.3)
Sodium: 141 mEq/L (ref 137–147)
Total Bilirubin: 0.3 mg/dL (ref 0.3–1.2)
Total Protein: 8.1 g/dL (ref 6.0–8.3)

## 2014-01-31 LAB — CBC WITH DIFFERENTIAL/PLATELET
Basophils Absolute: 0 10*3/uL (ref 0.0–0.1)
Basophils Relative: 1 % (ref 0–1)
Eosinophils Absolute: 0 10*3/uL (ref 0.0–0.7)
Eosinophils Relative: 1 % (ref 0–5)
HCT: 29.1 % — ABNORMAL LOW (ref 36.0–46.0)
Hemoglobin: 8.7 g/dL — ABNORMAL LOW (ref 12.0–15.0)
Lymphocytes Relative: 42 % (ref 12–46)
Lymphs Abs: 1.4 10*3/uL (ref 0.7–4.0)
MCH: 21.1 pg — ABNORMAL LOW (ref 26.0–34.0)
MCHC: 29.9 g/dL — ABNORMAL LOW (ref 30.0–36.0)
MCV: 70.5 fL — ABNORMAL LOW (ref 78.0–100.0)
Monocytes Absolute: 0.4 10*3/uL (ref 0.1–1.0)
Monocytes Relative: 13 % — ABNORMAL HIGH (ref 3–12)
Neutro Abs: 1.5 10*3/uL — ABNORMAL LOW (ref 1.7–7.7)
Neutrophils Relative %: 44 % (ref 43–77)
Platelets: 275 10*3/uL (ref 150–400)
RBC: 4.13 MIL/uL (ref 3.87–5.11)
RDW: 19.5 % — ABNORMAL HIGH (ref 11.5–15.5)
WBC: 3.4 10*3/uL — ABNORMAL LOW (ref 4.0–10.5)

## 2014-01-31 LAB — URINALYSIS, ROUTINE W REFLEX MICROSCOPIC
Bilirubin Urine: NEGATIVE
Glucose, UA: NEGATIVE mg/dL
Leukocytes, UA: NEGATIVE
Nitrite: NEGATIVE
Protein, ur: NEGATIVE mg/dL
Specific Gravity, Urine: >1.03 — ABNORMAL HIGH (ref 1.005–1.030)
Urobilinogen, UA: 0.2 mg/dL (ref 0.0–1.0)
pH: 5.5 (ref 5.0–8.0)

## 2014-01-31 LAB — RETICULOCYTES
RBC.: 4.13 MIL/uL (ref 3.87–5.11)
Retic Count, Absolute: 24.8 10*3/uL (ref 19.0–186.0)
Retic Ct Pct: 0.6 % (ref 0.4–3.1)

## 2014-01-31 MED ORDER — CIPROFLOXACIN HCL 250 MG PO TABS: 250.0000 mg | ORAL_TABLET | Freq: Two times a day (BID) | ORAL | Status: DC

## 2014-01-31 NOTE — Patient Instructions (Addendum)
[facility] Discharge Instructions  RECOMMENDATIONS MADE BY THE CONSULTANT AND ANY TEST RESULTS WILL BE SENT TO YOUR REFERRING PHYSICIAN.  EXAM FINDINGS BY THE PHYSICIAN TODAY AND SIGNS OR SYMPTOMS TO REPORT TO CLINIC OR PRIMARY PHYSICIAN: Exam and findings as discussed by Dr. Zigmund DanielFormanek.  Will check some labs today and will plan to give you feraheme infusions on 7/14 & 7/21.  Report increased fatigue, increased ice intake, etc.  We will also check a urinalysis today.  MEDICATIONS PRESCRIBED:  Cipro-take as directed.  INSTRUCTIONS/FOLLOW-UP: Follow-up with feraheme infusions on the next 2 Tuesdays and lab and office visit in 6 weeks.  Thank you for choosing [facility] to provide your oncology and hematology care.  To afford each patient quality time with our providers, please arrive at least 15 minutes before your scheduled appointment time.  With your help, our goal is to use those 15 minutes to complete the necessary work-up to ensure our physicians have the information they need to help with your evaluation and healthcare recommendations.    Effective January 1st, 2014, we ask that you re-schedule your appointment with our physicians should you arrive 10 or more minutes late for your appointment.  We strive to give you quality time with our providers, and arriving late affects you and other patients whose appointments are after yours.    Again, thank you for choosing [facility].  Our hope is that these requests will decrease the amount of time that you wait before being seen by our physicians.       _____________________________________________________________  Should you have questions after your visit to [facility], please contact our office at [phone] between the hours of 8:30 a.m. and 4:30 p.m.  Voicemails left after 4:30 p.m. will not be returned until the following business day.  For prescription refill requests, have  your pharmacy contact our office with your prescription refill request.    _______________________________________________________________  We hope that we have given you very good care.  You may receive a patient satisfaction survey in the mail, please complete it and return it as soon as possible.  We value your feedback!  _______________________________________________________________  Have you asked about our STAR program?  STAR stands for Survivorship Training and Rehabilitation, and this is a nationally recognized cancer care program that focuses on survivorship and rehabilitation.  Cancer and cancer treatments may cause problems, such as, pain, making you feel tired and keeping you from doing the things that you need or want to do. Cancer rehabilitation can help. Our goal is to reduce these troubling effects and help you have the best quality of life possible.  You may receive a survey from a nurse that asks questions about your current state of health.  Based on the survey results, all eligible patients will be referred to the [facility] program for an evaluation so we can better serve you!  A frequently asked questions sheet is available upon request.

## 2014-01-31 NOTE — Addendum Note (Signed)
Addended by: Evelena LeydenBURGESS, Rekha Hobbins S on: 01/31/2014 06:31 PM   Modules accepted: Orders

## 2014-01-31 NOTE — Progress Notes (Signed)
Hudson A. Barnet Glasgow, M.D.  NEW PATIENT EVALUATION   Name: Candice Ross [facility] [place] Date: 01/31/2014 MRN: [id number] DOB: 10/29/1970  PCP: No PCP Per Patient   REFERRING PHYSICIAN: No ref. provider Maryan Rued, M.D.  REASON FOR REFERRAL: Iron deficiency     HISTORY OF PRESENT ILLNESS:Candice Ross is a 43 y.o. female who is referred by her gastroenterologist for iron deficiency with demonstrated external hemorrhoids on colonoscopy and evidence of an anastomotic ulcer, status post gastrojejunostomy(gastric bypass) on upper endoscopy. Ferritin done 12/31/2013 was 4 with B12 level of 301 and hemoglobin of 9.1. She has lost approximately 200 pounds since her gastric bypass surgery. There is a history of ulcerative colitis which he takes no medication at this time for which he took as a call in the past. She currently uses proton pump inhibitor therapy for an anastomotic ulcer that was seen at the site of her gastrojejunostomy by EGD. She has a craving for ice along with cracking of the corners of her lips. She denies any all changes. She also denies any diarrhea, melena, hematochezia, but has had hematuria without dysuria or frequency. She denies any cough, shortness of breath, fever, night sweats and did experience a menstrual cycle 2 weeks ago lasting 10 days.   PAST MEDICAL HISTORY:  has a past medical history of Ulcerative colitis and Anemia.     PAST SURGICAL HISTORY: Past Surgical History  Procedure Laterality Date  . Laparoscopic gastric bypass      [place]  . Colonoscopy N/A 01/13/2014    Procedure: COLONOSCOPY;  Surgeon: Danie Binder, MD;  [facility];  Service: Endoscopy;  Laterality: N/A;  10:30  . Esophagogastroduodenoscopy N/A 01/13/2014    Procedure: ESOPHAGOGASTRODUODENOSCOPY (EGD);  Surgeon: Danie Binder, MD;  Location[facility];  Service: Endoscopy;  Laterality: N/A;      CURRENT MEDICATIONS: has a current medication list which includes the following prescription(s): cholecalciferol, cyanocobalamin, linaclotide, multiple vitamin, omeprazole, and ciprofloxacin.   ALLERGIES: Morphine and related   SOCIAL HISTORY:  reports that she has never smoked. She has never used smokeless tobacco. She reports that she does not drink alcohol or use illicit drugs.   FAMILY HISTORY: family history is negative for Colon cancer.    REVIEW OF SYSTEMS:  Other than that discussed above is noncontributory.    PHYSICAL EXAM:  height is 5' 4.5" (1.638 m) and weight is 204 lb 9.6 oz (92.806 kg). Her oral temperature is 98.3 F (36.8 C). Her blood pressure is 109/58 and her pulse is 75. Her respiration is 16.    GENERAL:alert, no distress and comfortable SKIN: skin color, texture, turgor are normal, no rashes or significant lesions EYES: normal, Conjunctiva are pink and non-injected, sclera clear OROPHARYNX:no exudate, no erythema and lips, buccal mucosa, and tongue normal. Angular cheilitis NECK: supple, thyroid normal size, non-tender, without nodularity CHEST: Normal AP diameter with no breast masses. LYMPH:  no palpable lymphadenopathy in the cervical, axillary or inguinal LUNGS: clear to auscultation and percussion with normal breathing effort HEART: regular rate & rhythm and no murmurs ABDOMEN:abdomen soft, non-tender and normal bowel sounds. No distention. No suprapubic tenderness. MUSCULOSKELETALl:no cyanosis of digits, no clubbing or edema  NEURO: alert & oriented x 3 with fluent speech, no focal motor/sensory deficits    LABORATORY DATA:  No visits with results within 30 Day(s) from this visit. Latest known visit with results is:  Office Visit on 12/31/2013  Component Date Value Ref Range Status  . WBC 12/31/2013 4.5  4.0 - 10.5 K/uL Final  . RBC 12/31/2013 4.49  3.87 - 5.11 MIL/uL Final  . Hemoglobin 12/31/2013 9.1* 12.0 - 15.0 g/dL Final  . HCT  12/31/2013 30.5* 36.0 - 46.0 % Final  . MCV 12/31/2013 67.9* 78.0 - 100.0 fL Final  . MCH 12/31/2013 20.3* 26.0 - 34.0 pg Final  . MCHC 12/31/2013 29.8* 30.0 - 36.0 g/dL Final  . RDW 12/31/2013 19.9* 11.5 - 15.5 % Final  . Platelets 12/31/2013 315  150 - 400 K/uL Final  . Neutrophils Relative % 12/31/2013 46  43 - 77 % Final  . Neutro Abs 12/31/2013 2.1  1.7 - 7.7 K/uL Final  . Lymphocytes Relative 12/31/2013 37  12 - 46 % Final  . Lymphs Abs 12/31/2013 1.7  0.7 - 4.0 K/uL Final  . Monocytes Relative 12/31/2013 16* 3 - 12 % Final  . Monocytes Absolute 12/31/2013 0.7  0.1 - 1.0 K/uL Final  . Eosinophils Relative 12/31/2013 1  0 - 5 % Final  . Eosinophils Absolute 12/31/2013 0.0  0.0 - 0.7 K/uL Final  . Basophils Relative 12/31/2013 0  0 - 1 % Final  . Basophils Absolute 12/31/2013 0.0  0.0 - 0.1 K/uL Final  . Smear Review 12/31/2013 Criteria for review not met   Final  . Vit D, 25-Hydroxy 12/31/2013 41  30 - 89 ng/mL Final   Comment: This assay accurately quantifies Vitamin D, which is the sum of the                          25-Hydroxy forms of Vitamin D2 and D3.  Studies have shown that the                          optimum concentration of 25-Hydroxy Vitamin D is 30 ng/mL or higher.                           Concentrations of Vitamin D between 20 and 29 ng/mL are considered to                          be insufficient and concentrations less than 20 ng/mL are considered                          to be deficient for Vitamin D.  . Vitamin B-12 12/31/2013 301  211 - 911 pg/mL Final  . Iron 12/31/2013 23* 42 - 145 ug/dL Final  . Ferritin 12/31/2013 4* 10 - 291 ng/mL Final    Urinalysis No results found for this basename: colorurine,  appearanceur,  labspec,  phurine,  glucoseu,  hgbur,  bilirubinur,  ketonesur,  proteinur,  urobilinogen,  nitrite,  leukocytesur      _0 : No results found.  PATHOLOGY: No pathology. Peripheral smear with microcytic hypochromic RBCs. No target  cells.   IMPRESSION:  #1. Iron deficiency anemia secondary to malabsorption and gastric bypass surgery along with periodic menstrual chronic blood loss. #2. Status post gastric bypass for obesity. #3. Ulcerative colitis, currently on no treatment but with irritable bowel syndrome treated with Linzess. #4. Gastrojejunal anastomotic ulcer, on proton pump inhibitor therapy, no evidence of malignancy. #5. Vitamin B12 deficiency, on supplements. #6. Hematuria, asymptomatic.  PLAN:  #1. IV Feraheme on 02/04/2014 and 02/11/2014. #2. Urinalysis and urine culture. #3. Cipro 200 mg twice a day for 5 days. #4. Followup in 6 weeks with CBC and ferritin.  I appreciate the opportunity sharing in her care.   ,  A, MD 01/31/2014 3:24 PM   DISCLAIMER:  This note was dictated with voice recognition softwre.  Similar sounding words can inadvertently be transcribed inaccurately and may not be corrected upon review.                

## 2014-02-01 LAB — INTRINSIC FACTOR ANTIBODIES: Intrinsic Factor: NEGATIVE

## 2014-02-01 LAB — FERRITIN: Ferritin: 3 ng/mL — ABNORMAL LOW (ref 10–291)

## 2014-02-02 LAB — URINE CULTURE
Colony Count: NO GROWTH
Culture: NO GROWTH

## 2014-02-03 ENCOUNTER — Telehealth (HOSPITAL_COMMUNITY): Payer: Self-pay | Admitting: Hematology and Oncology

## 2014-02-03 LAB — ANTI-PARIETAL ANTIBODY: Parietal Cell Antibody-IgG: NEGATIVE

## 2014-02-03 NOTE — Telephone Encounter (Signed)
[employer] [phone] PER YOLANDA T AUTH IS NOT REQUIRED FOR [id number]

## 2014-02-04 ENCOUNTER — Encounter (HOSPITAL_BASED_OUTPATIENT_CLINIC_OR_DEPARTMENT_OTHER): Payer: BC Managed Care – PPO

## 2014-02-04 VITALS — BP 124/69 | HR 62 | Temp 97.9°F | Resp 16

## 2014-02-04 DIAGNOSIS — D509 Iron deficiency anemia, unspecified: Secondary | ICD-10-CM

## 2014-02-04 LAB — HEMOGLOBINOPATHY EVALUATION
Hemoglobin Other: 0 %
Hgb A2 Quant: 1.8 % — ABNORMAL LOW (ref 2.2–3.2)
Hgb A: 98.2 % — ABNORMAL HIGH (ref 96.8–97.8)
Hgb F Quant: 0 % (ref 0.0–2.0)
Hgb S Quant: 0 %

## 2014-02-04 MED ORDER — SODIUM CHLORIDE 0.9 % IJ SOLN
10.0000 mL | INTRAMUSCULAR | Status: DC | PRN
  Administered 2014-02-04: 10 mL

## 2014-02-04 MED ORDER — SODIUM CHLORIDE 0.9 % IV SOLN
Freq: Once | INTRAVENOUS | Status: AC
  Administered 2014-02-04: 13:00:00 via INTRAVENOUS

## 2014-02-04 MED ORDER — SODIUM CHLORIDE 0.9 % IV SOLN
510.0000 mg | Freq: Once | INTRAVENOUS | Status: AC
  Administered 2014-02-04: 510 mg via INTRAVENOUS
  Filled 2014-02-04: qty 17

## 2014-02-04 NOTE — Progress Notes (Signed)
Tolerated well

## 2014-02-05 ENCOUNTER — Telehealth: Payer: Self-pay | Admitting: Gastroenterology

## 2014-02-05 NOTE — Telephone Encounter (Signed)
Please call pt. Her colon and RECTUM biopsies are normal. SHE HAS AN ULCER AT HER ANASTOMOSIS DUE TO LOW BLOOD SUPPLY. HER stomach Bx shows mild gastritis.    CONTINUE TO SEE HEMATOLOGY FOR IV IRON.  FOLLOW A LOW FAT/HIGH FIBER DIET. AVOID ITEMS THAT CAUSE BLOATING.   CONTINUE OMEPRAZOLE.  TAKE 30 MINUTES PRIOR TO YOUR FIRST MEAL.  CONTINUE LINZESS. TAKE 30 MINS PRIOR TO FOOD AND IF NO BM EVERY OTHER DAY THEN TAKE WITH FOOD.  AVOID ITEMS THAT TRIGGER GASTRITIS.   FOLLOW UP IN SEP 2015 E30 ANEMIA, CONSTIPATION.  NEXT TCS IN 10 YEARS.

## 2014-02-05 NOTE — Telephone Encounter (Signed)
Called and informed pt. Also mailed the info to her.

## 2014-02-06 NOTE — Telephone Encounter (Signed)
Reminder in epic °

## 2014-02-11 ENCOUNTER — Encounter (HOSPITAL_BASED_OUTPATIENT_CLINIC_OR_DEPARTMENT_OTHER): Payer: BC Managed Care – PPO

## 2014-02-11 VITALS — BP 127/76 | HR 55 | Temp 97.9°F | Resp 18

## 2014-02-11 DIAGNOSIS — D509 Iron deficiency anemia, unspecified: Secondary | ICD-10-CM

## 2014-02-11 MED ORDER — SODIUM CHLORIDE 0.9 % IJ SOLN
10.0000 mL | INTRAMUSCULAR | Status: DC | PRN
  Administered 2014-02-11: 10 mL

## 2014-02-11 MED ORDER — SODIUM CHLORIDE 0.9 % IV SOLN
Freq: Once | INTRAVENOUS | Status: AC
  Administered 2014-02-11: 13:00:00 via INTRAVENOUS

## 2014-02-11 MED ORDER — SODIUM CHLORIDE 0.9 % IV SOLN
510.0000 mg | Freq: Once | INTRAVENOUS | Status: AC
  Administered 2014-02-11: 510 mg via INTRAVENOUS
  Filled 2014-02-11: qty 17

## 2014-02-11 NOTE — Progress Notes (Signed)
1410 - Tolerated infusion well; no s/s reaction noted.  In no apparent distress, a&ox4.

## 2014-03-14 ENCOUNTER — Other Ambulatory Visit (HOSPITAL_COMMUNITY): Payer: BC Managed Care – PPO

## 2014-03-14 ENCOUNTER — Ambulatory Visit (HOSPITAL_COMMUNITY): Payer: BC Managed Care – PPO

## 2014-03-17 ENCOUNTER — Other Ambulatory Visit (HOSPITAL_COMMUNITY): Payer: BC Managed Care – PPO

## 2014-03-19 ENCOUNTER — Encounter (HOSPITAL_COMMUNITY): Payer: Self-pay

## 2014-03-19 ENCOUNTER — Encounter (HOSPITAL_COMMUNITY): Payer: BC Managed Care – PPO

## 2014-03-19 ENCOUNTER — Encounter (HOSPITAL_COMMUNITY): Payer: BC Managed Care – PPO | Attending: Hematology and Oncology

## 2014-03-19 VITALS — BP 135/73 | HR 63 | Temp 98.2°F | Resp 18 | Wt 205.0 lb

## 2014-03-19 DIAGNOSIS — D509 Iron deficiency anemia, unspecified: Secondary | ICD-10-CM

## 2014-03-19 DIAGNOSIS — K589 Irritable bowel syndrome without diarrhea: Secondary | ICD-10-CM | POA: Diagnosis present

## 2014-03-19 DIAGNOSIS — Z9884 Bariatric surgery status: Secondary | ICD-10-CM | POA: Insufficient documentation

## 2014-03-19 DIAGNOSIS — K581 Irritable bowel syndrome with constipation: Secondary | ICD-10-CM

## 2014-03-19 DIAGNOSIS — R718 Other abnormality of red blood cells: Secondary | ICD-10-CM | POA: Diagnosis present

## 2014-03-19 LAB — CBC WITH DIFFERENTIAL/PLATELET
Basophils Absolute: 0 10*3/uL (ref 0.0–0.1)
Basophils Relative: 0 % (ref 0–1)
Eosinophils Absolute: 0 10*3/uL (ref 0.0–0.7)
Eosinophils Relative: 0 % (ref 0–5)
HCT: 36.3 % (ref 36.0–46.0)
Hemoglobin: 11.5 g/dL — ABNORMAL LOW (ref 12.0–15.0)
Lymphocytes Relative: 36 % (ref 12–46)
Lymphs Abs: 1.7 10*3/uL (ref 0.7–4.0)
MCH: 26 pg (ref 26.0–34.0)
MCHC: 31.7 g/dL (ref 30.0–36.0)
MCV: 82.1 fL (ref 78.0–100.0)
Monocytes Absolute: 0.4 10*3/uL (ref 0.1–1.0)
Monocytes Relative: 8 % (ref 3–12)
Neutro Abs: 2.6 10*3/uL (ref 1.7–7.7)
Neutrophils Relative %: 56 % (ref 43–77)
Platelets: 187 10*3/uL (ref 150–400)
RBC: 4.42 MIL/uL (ref 3.87–5.11)
WBC: 4.7 10*3/uL (ref 4.0–10.5)

## 2014-03-19 NOTE — Progress Notes (Signed)
[facility]  OFFICE PROGRESS NOTE  No PCP Per Patient No address on file  DIAGNOSIS: Iron deficiency anemia - Plan: CBC with Differential, Ferritin, CANCELED: Ferritin  RBC microcytosis - Plan: CBC with Differential, CANCELED: Ferritin  Irritable bowel syndrome with constipation - Plan: CBC with Differential, CANCELED: Ferritin  Status post gastric bypass for obesity - Plan: CBC with Differential, CANCELED: Ferritin  Chief Complaint  Patient presents with  . Iron deficiency  . Is was gastric bypass with anastomotic ulcer    CURRENT THERAPY: IV Feraheme on 02/04/2014 and 02/11/2014.  INTERVAL HISTORY: Candice Ross 43 y.o. female returns for followup of iron deficiency anemia secondary to malabsorption, status post gastric bypass, following intravenous iron on 02/04/2014 and 02/11/2014. She was found to have an anastomotic ulcer on upper endoscopy and has been as well on chronic proton pump inhibitor therapy.  She continues to menstruate almost on a regular basis with an IUD in place which is appropriately placed. She denies any fever, night sweats, or further craving for ice. She has felt more fatigued of late and is having problems falling asleep working on a 4 AM to 2 PM shift. She denies any hematuria, melena, hematochezia, hemoptysis, or epistaxis.   MEDICAL HISTORY: Past Medical History  Diagnosis Date  . Ulcerative colitis     POSSIBLE  . Anemia     INTERIM HISTORY: has Anemia; Abdominal pain, unspecified site; GERD (gastroesophageal reflux disease); Irritable bowel syndrome with constipation; and Status post gastric bypass for obesity on her problem list.    ALLERGIES:  is allergic to morphine and related.  MEDICATIONS: has a current medication list which includes the following prescription(s): cholecalciferol, ciprofloxacin, cyanocobalamin, linaclotide, multiple vitamin, and omeprazole.  SURGICAL HISTORY:  Past  Surgical History  Procedure Laterality Date  . Laparoscopic gastric bypass      [place]  . Colonoscopy N/A 01/13/2014    Procedure: COLONOSCOPY;  Surgeon: West Bali, MD;  [facility];  Service: Endoscopy;  Laterality: N/A;  10:30  . Esophagogastroduodenoscopy N/A 01/13/2014    Procedure: ESOPHAGOGASTRODUODENOSCOPY (EGD);  Surgeon: West Bali, MD;  Location[facility];  Service: Endoscopy;  Laterality: N/A;    FAMILY HISTORY: family history is negative for Colon cancer.  SOCIAL HISTORY:  reports that she has never smoked. She has never used smokeless tobacco. She reports that she does not drink alcohol or use illicit drugs.  REVIEW OF SYSTEMS:  Other than that discussed above is noncontributory.  PHYSICAL EXAMINATION: ECOG PERFORMANCE STATUS: 1 - Symptomatic but completely ambulatory  Blood pressure 135/73, pulse 63, temperature 98.2 F (36.8 C), temperature source Oral, resp. rate 18, weight 205 lb (92.987 kg).  GENERAL:alert, no distress and comfortable SKIN: skin color, texture, turgor are normal, no rashes or significant lesions EYES: PERLA; Conjunctiva are pink and non-injected, sclera clear SINUSES: No redness or tenderness over maxillary or ethmoid sinuses OROPHARYNX:no exudate, no erythema on lips, buccal mucosa, or tongue. NECK: supple, thyroid normal size, non-tender, without nodularity. No masses CHEST: Normal AP diameter with no breast masses. LYMPH:  no palpable lymphadenopathy in the cervical, axillary or inguinal LUNGS: clear to auscultation and percussion with normal breathing effort HEART: regular rate & rhythm and no murmurs. ABDOMEN:abdomen soft, non-tender and normal bowel sounds. Obese and soft with no organomegaly, ascites, or CVA tenderness. MUSCULOSKELETAL:no cyanosis of digits and no clubbing. Range of motion normal.  NEURO: alert & oriented x  3 with fluent speech, no focal motor/sensory deficits   LABORATORY DATA: IV Feraheme  given on 02/04/2014 and 02/11/2014: Results for SHAWANDA, SIEVERT (MRN [id number]) as of 03/19/2014 17:02  Ref. Range 01/31/2014 15:09 03/19/2014 14:45  Hemoglobin Latest Range: 12.0-15.0 g/dL 8.7 (L) 40.9 (L)     Office Visit on 03/19/2014  Component Date Value Ref Range Status  . WBC 03/19/2014 4.7  4.0 - 10.5 K/uL Final  . RBC 03/19/2014 4.42  3.87 - 5.11 MIL/uL Final  . Hemoglobin 03/19/2014 11.5* 12.0 - 15.0 g/dL Final  . HCT 81/19/1478 36.3  36.0 - 46.0 % Final  . MCV 03/19/2014 82.1  78.0 - 100.0 fL Final  . MCH 03/19/2014 26.0  26.0 - 34.0 pg Final  . MCHC 03/19/2014 31.7  30.0 - 36.0 g/dL Final  . RDW 29/56/2130 NOT CALCULATED  11.5 - 15.5 % Corrected  . Platelets 03/19/2014 187  150 - 400 K/uL Final  . Neutrophils Relative % 03/19/2014 56  43 - 77 % Final  . Lymphocytes Relative 03/19/2014 36  12 - 46 % Final  . Monocytes Relative 03/19/2014 8  3 - 12 % Final  . Eosinophils Relative 03/19/2014 0  0 - 5 % Final  . Basophils Relative 03/19/2014 0  0 - 1 % Final  . Neutro Abs 03/19/2014 2.6  1.7 - 7.7 K/uL Final  . Lymphs Abs 03/19/2014 1.7  0.7 - 4.0 K/uL Final  . Monocytes Absolute 03/19/2014 0.4  0.1 - 1.0 K/uL Final  . Eosinophils Absolute 03/19/2014 0.0  0.0 - 0.7 K/uL Final  . Basophils Absolute 03/19/2014 0.0  0.0 - 0.1 K/uL Final  . RBC Morphology 03/19/2014 SCHISTOCYTES PRESENT (2-5/hpf)   Final   [place]  . Smear Review 03/19/2014 LARGE PLATELETS PRESENT   Final   GIANT PLATELETS SEEN    PATHOLOGY: No new pathology.  Urinalysis    Component Value Date/Time   COLORURINE YELLOW 01/31/2014 1509   APPEARANCEUR CLEAR 01/31/2014 1509   LABSPEC >1.030* 01/31/2014 1509   PHURINE 5.5 01/31/2014 1509   GLUCOSEU NEGATIVE 01/31/2014 1509   HGBUR LARGE* 01/31/2014 1509   BILIRUBINUR NEGATIVE 01/31/2014 1509   KETONESUR TRACE* 01/31/2014 1509   PROTEINUR NEGATIVE 01/31/2014 1509   UROBILINOGEN 0.2 01/31/2014 1509   NITRITE NEGATIVE 01/31/2014 1509   LEUKOCYTESUR  NEGATIVE 01/31/2014 1509    RADIOGRAPHIC STUDIES: No results found.  ASSESSMENT:  #1. Iron deficiency anemia secondary to malabsorption due to previous gastric bypass surgery, continued blood loss via menstruation, and anastomotic ulcer. #2. Ulcerative colitis, currently on no treatment but with irritable bowel syndrome treated with Linzess. #3. Vitamin B12 deficiency, on supplements. #4. Morbid obesity, status post gastric bypass #5. Menometrorrhagia.Marland Kitchen   PLAN:  #1. Followup in 2 months with CBC and ferritin.   All questions were answered. The patient knows to call the clinic with any problems, questions or concerns. We can certainly see the patient much sooner if necessary.   I spent 25 minutes counseling the patient face to face. The total time spent in the appointment was 30 minutes.    Maurilio Lovely, MD 03/19/2014 5:02 PM  DISCLAIMER:  This note was dictated with voice recognition software.  Similar sounding words can inadvertently be transcribed inaccurately and may not be corrected upon review.

## 2014-03-19 NOTE — Patient Instructions (Signed)
[facility] Discharge Instructions  RECOMMENDATIONS MADE BY THE CONSULTANT AND ANY TEST RESULTS WILL BE SENT TO YOUR REFERRING PHYSICIAN.   Return in 2 months for lab work and office visit.   Thank you for choosing [facility] to provide your oncology and hematology care.  To afford each patient quality time with our providers, please arrive at least 15 minutes before your scheduled appointment time.  With your help, our goal is to use those 15 minutes to complete the necessary work-up to ensure our physicians have the information they need to help with your evaluation and healthcare recommendations.    Effective January 1st, 2014, we ask that you re-schedule your appointment with our physicians should you arrive 10 or more minutes late for your appointment.  We strive to give you quality time with our providers, and arriving late affects you and other patients whose appointments are after yours.    Again, thank you for choosing [facility].  Our hope is that these requests will decrease the amount of time that you wait before being seen by our physicians.       _____________________________________________________________  Should you have questions after your visit to [facility], please contact our office at [phone] between the hours of 8:30 a.m. and 4:30 p.m.  Voicemails left after 4:30 p.m. will not be returned until the following business day.  For prescription refill requests, have your pharmacy contact our office with your prescription refill request.    _______________________________________________________________  We hope that we have given you very good care.  You may receive a patient satisfaction survey in the mail, please complete it and return it as soon as possible.  We value your feedback!  _______________________________________________________________  Have you asked about our STAR program?  STAR stands  for Survivorship Training and Rehabilitation, and this is a nationally recognized cancer care program that focuses on survivorship and rehabilitation.  Cancer and cancer treatments may cause problems, such as, pain, making you feel tired and keeping you from doing the things that you need or want to do. Cancer rehabilitation can help. Our goal is to reduce these troubling effects and help you have the best quality of life possible.  You may receive a survey from a nurse that asks questions about your current state of health.  Based on the survey results, all eligible patients will be referred to the STAR program for an evaluation so we can better serve you!  A frequently asked questions sheet is available upon request.  

## 2014-03-19 NOTE — Progress Notes (Signed)
Candice Ross presented for [employer]. Labs per MD order drawn via Peripheral Line 23 gauge needle inserted in left antecubital.  Good blood return present. Procedure without incident.  Needle removed intact. Patient tolerated procedure well.

## 2014-03-21 ENCOUNTER — Encounter (HOSPITAL_COMMUNITY): Payer: BC Managed Care – PPO

## 2014-03-21 DIAGNOSIS — D509 Iron deficiency anemia, unspecified: Secondary | ICD-10-CM | POA: Diagnosis not present

## 2014-03-21 NOTE — Progress Notes (Signed)
Candice Ross presented for [employer]. Labs per MD order drawn via Peripheral Line 23 gauge needle inserted in left AC  Good blood return present. Procedure without incident.  Needle removed intact. Patient tolerated procedure well.  (In for ferritin level - was not done on previous visit.)

## 2014-03-22 LAB — FERRITIN: Ferritin: 36 ng/mL (ref 10–291)

## 2014-03-25 ENCOUNTER — Encounter: Payer: Self-pay | Admitting: Gastroenterology

## 2014-05-19 ENCOUNTER — Other Ambulatory Visit (HOSPITAL_COMMUNITY): Payer: BC Managed Care – PPO

## 2014-05-19 ENCOUNTER — Ambulatory Visit (HOSPITAL_COMMUNITY): Payer: BC Managed Care – PPO

## 2014-05-20 NOTE — Progress Notes (Signed)
This encounter was created in error - please disregard.

## 2014-05-29 ENCOUNTER — Encounter (HOSPITAL_COMMUNITY): Payer: Self-pay

## 2014-09-23 ENCOUNTER — Other Ambulatory Visit (HOSPITAL_COMMUNITY): Payer: Self-pay

## 2014-09-23 DIAGNOSIS — D649 Anemia, unspecified: Secondary | ICD-10-CM

## 2014-09-25 ENCOUNTER — Encounter (HOSPITAL_COMMUNITY): Payer: BLUE CROSS/BLUE SHIELD | Attending: Hematology & Oncology | Admitting: Hematology & Oncology

## 2014-09-25 ENCOUNTER — Encounter (HOSPITAL_BASED_OUTPATIENT_CLINIC_OR_DEPARTMENT_OTHER): Payer: BLUE CROSS/BLUE SHIELD

## 2014-09-25 ENCOUNTER — Encounter (HOSPITAL_COMMUNITY): Payer: Self-pay | Admitting: Hematology & Oncology

## 2014-09-25 VITALS — BP 130/78 | HR 66 | Temp 97.8°F | Resp 16 | Wt 197.2 lb

## 2014-09-25 DIAGNOSIS — K289 Gastrojejunal ulcer, unspecified as acute or chronic, without hemorrhage or perforation: Secondary | ICD-10-CM | POA: Diagnosis not present

## 2014-09-25 DIAGNOSIS — D509 Iron deficiency anemia, unspecified: Secondary | ICD-10-CM | POA: Insufficient documentation

## 2014-09-25 DIAGNOSIS — Z9884 Bariatric surgery status: Secondary | ICD-10-CM

## 2014-09-25 DIAGNOSIS — K909 Intestinal malabsorption, unspecified: Secondary | ICD-10-CM

## 2014-09-25 DIAGNOSIS — D649 Anemia, unspecified: Secondary | ICD-10-CM

## 2014-09-25 LAB — CBC WITH DIFFERENTIAL/PLATELET
Basophils Absolute: 0 10*3/uL (ref 0.0–0.1)
Basophils Relative: 0 % (ref 0–1)
Eosinophils Absolute: 0 10*3/uL (ref 0.0–0.7)
Eosinophils Relative: 1 % (ref 0–5)
HCT: 37.2 % (ref 36.0–46.0)
Hemoglobin: 12 g/dL (ref 12.0–15.0)
Lymphocytes Relative: 45 % (ref 12–46)
Lymphs Abs: 2 10*3/uL (ref 0.7–4.0)
MCH: 29.2 pg (ref 26.0–34.0)
MCHC: 32.3 g/dL (ref 30.0–36.0)
MCV: 90.5 fL (ref 78.0–100.0)
Monocytes Absolute: 0.4 10*3/uL (ref 0.1–1.0)
Monocytes Relative: 9 % (ref 3–12)
Neutro Abs: 1.9 10*3/uL (ref 1.7–7.7)
Neutrophils Relative %: 45 % (ref 43–77)
Platelets: 219 10*3/uL (ref 150–400)
RBC: 4.11 MIL/uL (ref 3.87–5.11)
RDW: 13.1 % (ref 11.5–15.5)
WBC: 4.3 10*3/uL (ref 4.0–10.5)

## 2014-09-25 LAB — FERRITIN: Ferritin: 9 ng/mL — ABNORMAL LOW (ref 10–291)

## 2014-09-25 NOTE — Progress Notes (Signed)
LABS FOR FERR,CBCD,, ADD B12

## 2014-09-25 NOTE — Patient Instructions (Signed)
[facility] at [facility] Discharge Instructions  RECOMMENDATIONS MADE BY THE CONSULTANT AND ANY TEST RESULTS WILL BE SENT TO YOUR REFERRING PHYSICIAN.  Exam and discussion by Dr. Galen ManilaPenland.  Will let you know if your iron level is low and you need an infusion. Report increased ice intake, fatigue, etc.  Follow-up in 6 months with labs and office visit.  Thank you for choosing [facility] at [facility] to provide your oncology and hematology care.  To afford each patient quality time with our provider, please arrive at least 15 minutes before your scheduled appointment time.    You need to re-schedule your appointment should you arrive 10 or more minutes late.  We strive to give you quality time with our providers, and arriving late affects you and other patients whose appointments are after yours.  Also, if you no show three or more times for appointments you may be dismissed from the clinic at the providers discretion.     Again, thank you for choosing [facility].  Our hope is that these requests will decrease the amount of time that you wait before being seen by our physicians.       _____________________________________________________________  Should you have questions after your visit to [facility], please contact our office at [phone] between the hours of 8:30 a.m. and 4:30 p.m.  Voicemails left after 4:30 p.m. will not be returned until the following business day.  For prescription refill requests, have your pharmacy contact our office.

## 2014-09-25 NOTE — Progress Notes (Signed)
DIAGNOSIS: iron deficiency anemia secondary to malabsorption, status post gastric bypass Anastomotic ulcer found on EGD, chronic PPI therapy Pre-menopausal  CURRENT THERAPY: IV iron replacement prn  INTERVAL HISTORY: Candice Ross 44 y.o. female returns for  follow-up of iron deficiency. She had her last infusions of IV iron in July of last year. She states she has noted increased fatigue and also craving for ice. She is sleeping more. And states these are her symptoms consistent with iron deficiency. She thinks she may need more IV iron.  MEDICAL HISTORY: Past Medical History  Diagnosis Date  . Ulcerative colitis     POSSIBLE  . Anemia     has Anemia; Abdominal pain, unspecified site; GERD (gastroesophageal reflux disease); Irritable bowel syndrome with constipation; and Status post gastric bypass for obesity on her problem list.     is allergic to morphine and related.  Ms. Fennel does not currently have medications on file.  SURGICAL HISTORY: Past Surgical History  Procedure Laterality Date  . Laparoscopic gastric bypass      [place]  . Colonoscopy N/A 01/13/2014    Procedure: COLONOSCOPY;  Surgeon: West Bali, MD;  [facility];  Service: Endoscopy;  Laterality: N/A;  10:30  . Esophagogastroduodenoscopy N/A 01/13/2014    Procedure: ESOPHAGOGASTRODUODENOSCOPY (EGD);  Surgeon: West Bali, MD;  [facility];  Service: Endoscopy;  Laterality: N/A;    SOCIAL HISTORY: History   Social History  . Marital Status: Divorced    Spouse Name: N/A  . Number of Children: N/A  . 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 Smoking status: Never Smoker   . Smokeless tobacco: Never Used  . Alcohol Use: No  . Drug Use: No  . Sexual Activity: Yes    Birth Control/ Protection: IUD   Other Topics Concern  . Not on file   Social History Narrative   . No narrative on file    FAMILY HISTORY: Family History  Problem Relation Age of Onset  . Colon cancer Neg Hx     Review of Systems  Constitutional: Positive for malaise/fatigue. Negative for fever, chills and weight loss.       Pagophagia  HENT: Negative for congestion, hearing loss, nosebleeds, sore throat and tinnitus.   Eyes: Negative for blurred vision, double vision, pain and discharge.  Respiratory: Negative for cough, hemoptysis, sputum production, shortness of breath and wheezing.   Cardiovascular: Negative for chest pain, palpitations, claudication, leg swelling and PND.  Gastrointestinal: Negative for heartburn, nausea, vomiting, abdominal pain, diarrhea, constipation, blood in stool and melena.  Genitourinary: Negative for dysuria, urgency, frequency and hematuria.  Musculoskeletal: Positive for joint pain. Negative for myalgias and falls.  Skin: Negative for itching and rash.  Neurological: Negative for dizziness, tingling, tremors, sensory change, speech change, focal weakness, seizures, loss of consciousness, weakness and headaches.  Endo/Heme/Allergies: Does not bruise/bleed easily.  Psychiatric/Behavioral: Negative for depression, suicidal ideas, memory loss and substance abuse. The patient is not nervous/anxious and does not have insomnia.     PHYSICAL EXAMINATION  ECOG PERFORMANCE STATUS: 0 - Asymptomatic  There were no vitals filed for this visit.  Physical Exam  Constitutional: She is oriented to person, place, and time and well-developed, well-nourished, and in no distress.  HENT:  Head: Normocephalic and atraumatic.  Nose: Nose normal.  Mouth/Throat: Oropharynx is clear  and moist. No oropharyngeal exudate.  Eyes: Conjunctivae and EOM are normal. Pupils are equal, round, and reactive to light. Right eye exhibits no discharge. Left eye exhibits no discharge. No scleral icterus.  Neck: Normal range of motion. Neck supple. No tracheal deviation present. No  thyromegaly present.  Cardiovascular: Normal rate, regular rhythm and normal heart sounds.  Exam reveals no gallop and no friction rub.   No murmur heard. Pulmonary/Chest: Effort normal and breath sounds normal. She has no wheezes. She has no rales.  Abdominal: Soft. Bowel sounds are normal. She exhibits no distension and no mass. There is no tenderness. There is no rebound and no guarding.  Musculoskeletal: Normal range of motion. She exhibits no edema.  Lymphadenopathy:    She has no cervical adenopathy.  Neurological: She is alert and oriented to person, place, and time. She has normal reflexes. No cranial nerve deficit. Gait normal. Coordination normal.  Skin: Skin is warm and dry. No rash noted.  Psychiatric: Mood, memory, affect and judgment normal.  Nursing note and vitals reviewed.   LABORATORY DATA:  CBC    Component Value Date/Time   WBC 4.7 03/19/2014 1445   RBC 4.42 03/19/2014 1445   RBC 4.13 01/31/2014 1509   HGB 11.5* 03/19/2014 1445   HCT 36.3 03/19/2014 1445   PLT 187 03/19/2014 1445   MCV 82.1 03/19/2014 1445   MCH 26.0 03/19/2014 1445   MCHC 31.7 03/19/2014 1445   RDW NOT CALCULATED 03/19/2014 1445   LYMPHSABS 1.7 03/19/2014 1445   MONOABS 0.4 03/19/2014 1445   EOSABS 0.0 03/19/2014 1445   BASOSABS 0.0 03/19/2014 1445   CMP     Component Value Date/Time   NA 141 01/31/2014 1509   K 3.8 01/31/2014 1509   CL 107 01/31/2014 1509   CO2 24 01/31/2014 1509   GLUCOSE 57* 01/31/2014 1509   BUN 12 01/31/2014 1509   CREATININE 0.84 01/31/2014 1509   CALCIUM 9.1 01/31/2014 1509   PROT 8.1 01/31/2014 1509   ALBUMIN 3.6 01/31/2014 1509   AST 20 01/31/2014 1509   ALT 9 01/31/2014 1509   ALKPHOS 60 01/31/2014 1509   BILITOT 0.3 01/31/2014 1509   GFRNONAA 83* 01/31/2014 1509   GFRAA >90 01/31/2014 1509       ASSESSMENT and THERAPY PLAN:   Iron deficiency secondary to malabsorption from gastric bypass  44 year old female with iron deficiency anemia. She  has evidence of iron malabsorption which is felt to be secondary to prior gastric bypass. I have advised her we will call her when all of her laboratory studies are available. If she needs additional iron replacement we will promptly arrange that for her. We will add a B12 to her next laboratory studies. We will continue with ongoing office visit and lab evaluation every 6 months.  All questions were answered. The patient knows to call the clinic with any problems, questions or concerns. We can certainly see the patient much sooner if necessary. This note was electronically signed. Arvil Chacoenland,Egor Fullilove Kristen 09/25/2014

## 2014-09-26 ENCOUNTER — Other Ambulatory Visit (HOSPITAL_COMMUNITY): Payer: Self-pay | Admitting: Oncology

## 2014-09-26 DIAGNOSIS — D509 Iron deficiency anemia, unspecified: Secondary | ICD-10-CM

## 2014-09-26 LAB — VITAMIN B12: Vitamin B-12: 377 pg/mL (ref 211–911)

## 2014-09-30 ENCOUNTER — Encounter (HOSPITAL_COMMUNITY): Payer: Self-pay

## 2014-10-02 ENCOUNTER — Encounter (HOSPITAL_BASED_OUTPATIENT_CLINIC_OR_DEPARTMENT_OTHER): Payer: BLUE CROSS/BLUE SHIELD

## 2014-10-02 VITALS — BP 125/75 | HR 64 | Temp 98.0°F | Resp 16

## 2014-10-02 DIAGNOSIS — D509 Iron deficiency anemia, unspecified: Secondary | ICD-10-CM

## 2014-10-02 DIAGNOSIS — D500 Iron deficiency anemia secondary to blood loss (chronic): Secondary | ICD-10-CM

## 2014-10-02 MED ORDER — SODIUM CHLORIDE 0.9 % IJ SOLN
10.0000 mL | INTRAMUSCULAR | Status: DC | PRN
  Administered 2014-10-02: 10 mL via INTRAVENOUS
  Filled 2014-10-02: qty 10

## 2014-10-02 MED ORDER — SODIUM CHLORIDE 0.9 % IV SOLN
INTRAVENOUS | Status: DC
  Administered 2014-10-02: 15:00:00 via INTRAVENOUS

## 2014-10-02 MED ORDER — FERUMOXYTOL INJECTION 510 MG/17 ML
510.0000 mg | Freq: Once | INTRAVENOUS | Status: AC
  Administered 2014-10-02: 510 mg via INTRAVENOUS
  Filled 2014-10-02: qty 17

## 2014-10-02 NOTE — Patient Instructions (Signed)
[facility] at [facility] Discharge Instructions  RECOMMENDATIONS MADE BY THE CONSULTANT AND ANY TEST RESULTS WILL BE SENT TO YOUR REFERRING PHYSICIAN.  IV iron for one dose today as ordered. Return as scheduled in September for labs and office visit. Report any issues/concerns as needed prior to appointments.  Thank you for choosing [facility] at [facility] to provide your oncology and hematology care.  To afford each patient quality time with our provider, please arrive at least 15 minutes before your scheduled appointment time.    You need to re-schedule your appointment should you arrive 10 or more minutes late.  We strive to give you quality time with our providers, and arriving late affects you and other patients whose appointments are after yours.  Also, if you no show three or more times for appointments you may be dismissed from the clinic at the providers discretion.     Again, thank you for choosing [facility].  Our hope is that these requests will decrease the amount of time that you wait before being seen by our physicians.       _____________________________________________________________  Should you have questions after your visit to [facility], please contact our office at [phone] between the hours of 8:30 a.m. and 4:30 p.m.  Voicemails left after 4:30 p.m. will not be returned until the following business day.  For prescription refill requests, have your pharmacy contact our office.

## 2014-10-02 NOTE — Progress Notes (Signed)
Tolerated iron infusion well. 

## 2014-10-12 ENCOUNTER — Encounter (HOSPITAL_COMMUNITY): Payer: Self-pay | Admitting: Hematology & Oncology

## 2014-12-09 ENCOUNTER — Other Ambulatory Visit (HOSPITAL_COMMUNITY): Payer: Self-pay | Admitting: Oncology

## 2014-12-09 DIAGNOSIS — D509 Iron deficiency anemia, unspecified: Secondary | ICD-10-CM

## 2014-12-10 ENCOUNTER — Encounter (HOSPITAL_COMMUNITY): Payer: BLUE CROSS/BLUE SHIELD | Attending: Hematology & Oncology

## 2014-12-10 ENCOUNTER — Other Ambulatory Visit (HOSPITAL_COMMUNITY): Payer: Self-pay | Admitting: Oncology

## 2014-12-10 DIAGNOSIS — D509 Iron deficiency anemia, unspecified: Secondary | ICD-10-CM

## 2014-12-10 DIAGNOSIS — K909 Intestinal malabsorption, unspecified: Secondary | ICD-10-CM | POA: Diagnosis not present

## 2014-12-10 DIAGNOSIS — K289 Gastrojejunal ulcer, unspecified as acute or chronic, without hemorrhage or perforation: Secondary | ICD-10-CM | POA: Diagnosis not present

## 2014-12-10 LAB — CBC
HCT: 35.6 % — ABNORMAL LOW (ref 36.0–46.0)
Hemoglobin: 11.5 g/dL — ABNORMAL LOW (ref 12.0–15.0)
MCH: 30 pg (ref 26.0–34.0)
MCHC: 32.3 g/dL (ref 30.0–36.0)
MCV: 93 fL (ref 78.0–100.0)
Platelets: 181 10*3/uL (ref 150–400)
RBC: 3.83 MIL/uL — ABNORMAL LOW (ref 3.87–5.11)
RDW: 13.9 % (ref 11.5–15.5)
WBC: 4 10*3/uL (ref 4.0–10.5)

## 2014-12-10 LAB — FERRITIN: Ferritin: 7 ng/mL — ABNORMAL LOW (ref 11–307)

## 2014-12-10 LAB — IRON AND TIBC
Iron: 31 ug/dL (ref 28–170)
Saturation Ratios: 8 % — ABNORMAL LOW (ref 10.4–31.8)
TIBC: 382 ug/dL (ref 250–450)
UIBC: 351 ug/dL

## 2014-12-10 NOTE — Progress Notes (Signed)
Labs drawn

## 2014-12-12 ENCOUNTER — Ambulatory Visit (HOSPITAL_COMMUNITY): Payer: BLUE CROSS/BLUE SHIELD

## 2014-12-15 ENCOUNTER — Ambulatory Visit (HOSPITAL_COMMUNITY): Payer: BLUE CROSS/BLUE SHIELD

## 2014-12-16 ENCOUNTER — Encounter (HOSPITAL_BASED_OUTPATIENT_CLINIC_OR_DEPARTMENT_OTHER): Payer: BLUE CROSS/BLUE SHIELD

## 2014-12-16 ENCOUNTER — Encounter (HOSPITAL_COMMUNITY): Payer: Self-pay

## 2014-12-16 VITALS — BP 113/55 | HR 76 | Temp 98.0°F | Resp 18

## 2014-12-16 DIAGNOSIS — D500 Iron deficiency anemia secondary to blood loss (chronic): Secondary | ICD-10-CM | POA: Diagnosis not present

## 2014-12-16 DIAGNOSIS — D509 Iron deficiency anemia, unspecified: Secondary | ICD-10-CM

## 2014-12-16 MED ORDER — SODIUM CHLORIDE 0.9 % IV SOLN
510.0000 mg | Freq: Once | INTRAVENOUS | Status: AC
  Administered 2014-12-16: 510 mg via INTRAVENOUS
  Filled 2014-12-16: qty 17

## 2014-12-16 MED ORDER — SODIUM CHLORIDE 0.9 % IV SOLN
INTRAVENOUS | Status: DC
  Administered 2014-12-16: 15:00:00 via INTRAVENOUS

## 2014-12-16 NOTE — Patient Instructions (Signed)
[facility] at [facility] Discharge Instructions  RECOMMENDATIONS MADE BY THE CONSULTANT AND ANY TEST RESULTS WILL BE SENT TO YOUR REFERRING PHYSICIAN.  Iron infusion today Follow up as scheduled Please call the clinic if you have any questions or concerns  Thank you for choosing [facility] at [facility] to provide your oncology and hematology care.  To afford each patient quality time with our provider, please arrive at least 15 minutes before your scheduled appointment time.    You need to re-schedule your appointment should you arrive 10 or more minutes late.  We strive to give you quality time with our providers, and arriving late affects you and other patients whose appointments are after yours.  Also, if you no show three or more times for appointments you may be dismissed from the clinic at the providers discretion.     Again, thank you for choosing [facility].  Our hope is that these requests will decrease the amount of time that you wait before being seen by our physicians.       _____________________________________________________________  Should you have questions after your visit to [facility], please contact our office at [phone] between the hours of 8:30 a.m. and 4:30 p.m.  Voicemails left after 4:30 p.m. will not be returned until the following business day.  For prescription refill requests, have your pharmacy contact our office.    

## 2014-12-16 NOTE — Progress Notes (Signed)
Almedia BallsLinda Carol Peatross Weisensel Tolerated iron infusion today Discharged ambulatory

## 2015-03-31 ENCOUNTER — Ambulatory Visit (HOSPITAL_COMMUNITY): Payer: BLUE CROSS/BLUE SHIELD | Admitting: Hematology & Oncology

## 2015-03-31 ENCOUNTER — Other Ambulatory Visit (HOSPITAL_COMMUNITY): Payer: BLUE CROSS/BLUE SHIELD

## 2015-04-01 NOTE — Progress Notes (Signed)
This encounter was created in error - please disregard.

## 2015-04-30 ENCOUNTER — Other Ambulatory Visit (HOSPITAL_COMMUNITY): Payer: Self-pay

## 2015-05-04 ENCOUNTER — Encounter (HOSPITAL_COMMUNITY): Payer: BLUE CROSS/BLUE SHIELD | Attending: Hematology & Oncology | Admitting: Hematology & Oncology

## 2015-05-04 ENCOUNTER — Encounter (HOSPITAL_BASED_OUTPATIENT_CLINIC_OR_DEPARTMENT_OTHER): Payer: BLUE CROSS/BLUE SHIELD

## 2015-05-04 ENCOUNTER — Encounter (HOSPITAL_COMMUNITY): Payer: Self-pay | Admitting: Hematology & Oncology

## 2015-05-04 VITALS — BP 126/75 | HR 56 | Temp 97.9°F | Resp 18 | Wt 207.8 lb

## 2015-05-04 DIAGNOSIS — Z23 Encounter for immunization: Secondary | ICD-10-CM | POA: Diagnosis not present

## 2015-05-04 DIAGNOSIS — L659 Nonscarring hair loss, unspecified: Secondary | ICD-10-CM | POA: Diagnosis not present

## 2015-05-04 DIAGNOSIS — K909 Intestinal malabsorption, unspecified: Secondary | ICD-10-CM

## 2015-05-04 DIAGNOSIS — D509 Iron deficiency anemia, unspecified: Secondary | ICD-10-CM

## 2015-05-04 DIAGNOSIS — Z9884 Bariatric surgery status: Secondary | ICD-10-CM | POA: Insufficient documentation

## 2015-05-04 DIAGNOSIS — D508 Other iron deficiency anemias: Secondary | ICD-10-CM

## 2015-05-04 DIAGNOSIS — R5383 Other fatigue: Secondary | ICD-10-CM | POA: Diagnosis not present

## 2015-05-04 LAB — CBC WITH DIFFERENTIAL/PLATELET
Basophils Absolute: 0 10*3/uL (ref 0.0–0.1)
Basophils Relative: 0 %
Eosinophils Absolute: 0 10*3/uL (ref 0.0–0.7)
Eosinophils Relative: 1 %
HCT: 32.3 % — ABNORMAL LOW (ref 36.0–46.0)
Hemoglobin: 10.1 g/dL — ABNORMAL LOW (ref 12.0–15.0)
Lymphocytes Relative: 44 %
Lymphs Abs: 1.5 10*3/uL (ref 0.7–4.0)
MCH: 28.1 pg (ref 26.0–34.0)
MCHC: 31.3 g/dL (ref 30.0–36.0)
MCV: 90 fL (ref 78.0–100.0)
Monocytes Absolute: 0.5 10*3/uL (ref 0.1–1.0)
Monocytes Relative: 15 %
Neutro Abs: 1.3 10*3/uL — ABNORMAL LOW (ref 1.7–7.7)
Neutrophils Relative %: 40 %
Platelets: 269 10*3/uL (ref 150–400)
RBC: 3.59 MIL/uL — ABNORMAL LOW (ref 3.87–5.11)
RDW: 13 % (ref 11.5–15.5)
WBC: 3.3 10*3/uL — ABNORMAL LOW (ref 4.0–10.5)

## 2015-05-04 LAB — IRON AND TIBC
Iron: 20 ug/dL — ABNORMAL LOW (ref 28–170)
Saturation Ratios: 4 % — ABNORMAL LOW (ref 10.4–31.8)
TIBC: 455 ug/dL — ABNORMAL HIGH (ref 250–450)
UIBC: 435 ug/dL

## 2015-05-04 LAB — COMPREHENSIVE METABOLIC PANEL
ALT: 13 U/L — ABNORMAL LOW (ref 14–54)
AST: 21 U/L (ref 15–41)
Albumin: 3.8 g/dL (ref 3.5–5.0)
Alkaline Phosphatase: 37 U/L — ABNORMAL LOW (ref 38–126)
Anion gap: 3 — ABNORMAL LOW (ref 5–15)
BUN: 16 mg/dL (ref 6–20)
CO2: 27 mmol/L (ref 22–32)
Calcium: 8 mg/dL — ABNORMAL LOW (ref 8.9–10.3)
Chloride: 108 mmol/L (ref 101–111)
Creatinine, Ser: 0.73 mg/dL (ref 0.44–1.00)
GFR calc Af Amer: >60 mL/min (ref >60)
GFR calc non Af Amer: >60 mL/min (ref >60)
Glucose, Bld: 66 mg/dL (ref 65–99)
Potassium: 4 mmol/L (ref 3.5–5.1)
Sodium: 138 mmol/L (ref 135–145)
Total Bilirubin: 0.4 mg/dL (ref 0.3–1.2)
Total Protein: 7.5 g/dL (ref 6.5–8.1)

## 2015-05-04 LAB — FERRITIN: Ferritin: 4 ng/mL — ABNORMAL LOW (ref 11–307)

## 2015-05-04 MED ORDER — INFLUENZA VAC SPLIT QUAD 0.5 ML IM SUSY
0.5000 mL | PREFILLED_SYRINGE | Freq: Once | INTRAMUSCULAR | Status: AC
  Administered 2015-05-04: 0.5 mL via INTRAMUSCULAR

## 2015-05-04 MED ORDER — INFLUENZA VAC SPLIT QUAD 0.5 ML IM SUSY
PREFILLED_SYRINGE | INTRAMUSCULAR | Status: AC
  Filled 2015-05-04: qty 0.5

## 2015-05-04 NOTE — Progress Notes (Signed)
DIAGNOSIS: Iron deficiency anemia secondary to malabsorption, status post gastric bypass Anastomotic ulcer found on EGD, chronic PPI therapy Pre-menopausal  CURRENT THERAPY: IV iron replacement prn  INTERVAL HISTORY: Candice Ross 44 y.o. female returns for  follow-up of iron deficiency.  Candice Ross is here alone and feels like she needs iron today. She likes to eat ice and crackers regularly. She is able to come in any day for an iron infusion. She denies blood in stool. She reports heavy vaginal bleeding and fatigue.  She has gained 60 lbs in the last 6 months and would like a diet pill. She attributes the weight gain to her fatigue and not having the energy to exercise. She denies ever having her thyroid checked. Review of her weights here in the clinic show overall stability.  The patient feels as though she needs vitamins because her hair has been falling out lately.   MEDICAL HISTORY: Past Medical History  Diagnosis Date  . Ulcerative colitis (HCC)     POSSIBLE  . Anemia     has Anemia; Abdominal pain, unspecified site; GERD (gastroesophageal reflux disease); Irritable bowel syndrome with constipation; and Status post gastric bypass for obesity on her problem list.     is allergic to morphine and related.  We administered Influenza vac split quadrivalent PF.  SURGICAL HISTORY: Past Surgical History  Procedure Laterality Date  . Laparoscopic gastric bypass      [place]  . Colonoscopy N/A 01/13/2014    Procedure: COLONOSCOPY;  Surgeon: West Bali, MD;  [facility];  Service: Endoscopy;  Laterality: N/A;  10:30  . Esophagogastroduodenoscopy N/A 01/13/2014    Procedure: ESOPHAGOGASTRODUODENOSCOPY (EGD);  Surgeon: West Bali, MD;  [facility];  Service: Endoscopy;  Laterality: N/A;    SOCIAL HISTORY: Social History   Social History  . Marital Status: Divorced    Spouse Name: N/A  . Number of Children: N/A  .  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 Smoking status: Never Smoker   . Smokeless tobacco: Never Used  . Alcohol Use: No  . Drug Use: No  . Sexual Activity: Yes    Birth Control/ Protection: IUD   Other Topics Concern  . Not on file   Social History Narrative    FAMILY HISTORY: Family History  Problem Relation Age of Onset  . Colon cancer Neg Hx     Review of Systems  Constitutional: Positive for malaise/fatigue. Negative for fever, chills and weight loss.       Pagophagia. Marland Kitchen  HENT: Negative for congestion, hearing loss, nosebleeds, sore throat and tinnitus.        Hair loss  Eyes: Negative for blurred vision, double vision, pain and discharge.  Respiratory: Negative for cough, hemoptysis, sputum production, shortness of breath and wheezing.   Cardiovascular: Negative for chest pain, palpitations, claudication, leg swelling and PND.  Gastrointestinal: Negative for heartburn, nausea, vomiting, abdominal pain, diarrhea, constipation, blood in stool and melena.  Genitourinary: Negative for dysuria, urgency, frequency and hematuria.  Musculoskeletal: Positive for joint pain. Negative for myalgias and falls.  Skin: Negative for itching and rash.  Neurological: Negative for dizziness, tingling, tremors, sensory change, speech change, focal weakness, seizures, loss of consciousness, weakness and headaches.  Endo/Heme/Allergies: Does not bruise/bleed easily.  Psychiatric/Behavioral: Negative for depression, suicidal ideas, memory loss and substance  abuse. The patient is not nervous/anxious and does not have insomnia.     PHYSICAL EXAMINATION  ECOG PERFORMANCE STATUS: 0 - Asymptomatic  Filed Vitals:   05/04/15 1049  BP: 126/75  Pulse: 56  Temp: 97.9 F (36.6 C)  Resp: 18    Physical Exam  Constitutional: She is oriented to person, place, and time and well-developed, well-nourished, and  in no distress.  HENT:  Head: Normocephalic and atraumatic.  Nose: Nose normal.  Mouth/Throat: Oropharynx is clear and moist. No oropharyngeal exudate.  Eyes: Conjunctivae and EOM are normal. Pupils are equal, round, and reactive to light. Right eye exhibits no discharge. Left eye exhibits no discharge. No scleral icterus.  Neck: Normal range of motion. Neck supple. No tracheal deviation present. No thyromegaly present.  Cardiovascular: Normal rate, regular rhythm and normal heart sounds.  Exam reveals no gallop and no friction rub.   No murmur heard. Pulmonary/Chest: Effort normal and breath sounds normal. She has no wheezes. She has no rales.  Abdominal: Soft. Bowel sounds are normal. She exhibits no distension and no mass. There is no tenderness. There is no rebound and no guarding.  Musculoskeletal: Normal range of motion. She exhibits no edema.  Lymphadenopathy:    She has no cervical adenopathy.  Neurological: She is alert and oriented to person, place, and time. She has normal reflexes. No cranial nerve deficit. Gait normal. Coordination normal.  Skin: Skin is warm and dry. No rash noted.  Psychiatric: Mood, memory, affect and judgment normal.  Nursing note and vitals reviewed.   LABORATORY DATA: I have reviewed the labs below.  CBC    Component Value Date/Time   WBC 3.3* 05/04/2015 1021   WBC 4.2 03/07/2013 1546   RBC 3.59* 05/04/2015 1021   RBC 4.13 01/31/2014 1509   RBC 4.30 03/07/2013 1546   HGB 10.1* 05/04/2015 1021   HGB 8.6* 03/07/2013 1546   HCT 32.3* 05/04/2015 1021   HCT 27.9* 03/07/2013 1546   PLT 269 05/04/2015 1021   PLT 299 03/07/2013 1546   MCV 90.0 05/04/2015 1021   MCV 65* 03/07/2013 1546   MCH 28.1 05/04/2015 1021   MCH 20.0* 03/07/2013 1546   MCHC 31.3 05/04/2015 1021   MCHC 30.8* 03/07/2013 1546   RDW 13.0 05/04/2015 1021   RDW 20.7* 03/07/2013 1546   LYMPHSABS 1.5 05/04/2015 1021   MONOABS 0.5 05/04/2015 1021   EOSABS 0.0 05/04/2015 1021    BASOSABS 0.0 05/04/2015 1021     CMP     Component Value Date/Time   NA 138 05/04/2015 1021   NA 139 03/07/2013 1546   K 4.0 05/04/2015 1021   K 3.7 03/07/2013 1546   CL 108 05/04/2015 1021   CL 108* 03/07/2013 1546   CO2 27 05/04/2015 1021   CO2 27 03/07/2013 1546   GLUCOSE 66 05/04/2015 1021   GLUCOSE 100* 03/07/2013 1546   BUN 16 05/04/2015 1021   BUN 10 03/07/2013 1546   CREATININE 0.73 05/04/2015 1021   CREATININE 0.83 03/07/2013 1546   CALCIUM 8.0* 05/04/2015 1021   CALCIUM 8.7 03/07/2013 1546   PROT 7.5 05/04/2015 1021   PROT 8.6* 03/07/2013 1546   ALBUMIN 3.8 05/04/2015 1021   ALBUMIN 3.4 03/07/2013 1546   AST 21 05/04/2015 1021   AST 24 03/07/2013 1546   ALT 13* 05/04/2015 1021   ALT 16 03/07/2013 1546   ALKPHOS 37* 05/04/2015 1021   ALKPHOS 82 03/07/2013 1546   BILITOT 0.4 05/04/2015 1021   BILITOT 0.2  03/07/2013 1546   GFRNONAA >60 05/04/2015 1021   GFRNONAA >60 03/07/2013 1546   GFRAA >60 05/04/2015 1021   GFRAA >60 03/07/2013 1546     ASSESSMENT and THERAPY PLAN:   Iron deficiency secondary to malabsorption from gastric bypass  44 year old female with iron deficiency anemia. She has evidence of iron malabsorption which is felt to be secondary to prior gastric bypass. Her menstrual cycles have been heavier. I have recommended ongoing 6 month follow-up, but with laboratory studies more frequently to prevent the development of symptomatic iron deficiency anemia.  Ms. Mataya is able to come in any day for an iron infusion. We will give her injectafer.   We will consider a thyroid check due to her concerns of hair loss.  I will see her again in 6 months.  All questions were answered. The patient knows to call the clinic with any problems, questions or concerns. We can certainly see the patient much sooner if necessary.  This note was electronically signed.  This document serves as a record of services personally performed by Loma Messing, MD. It was  created on her behalf by Delana Meyer, a trained medical scribe. The creation of this record is based on the scribe's personal observations and the provider's statements to them. This document has been checked and approved by the attending provider.  I have reviewed the above documentation for accuracy and completeness, and I agree with the above.  Novella Olive. Mourad Cwikla, MD  05/05/2015

## 2015-05-04 NOTE — Patient Instructions (Signed)
[facility] at [facility] Discharge Instructions  RECOMMENDATIONS MADE BY THE CONSULTANT AND ANY TEST RESULTS WILL BE SENT TO YOUR REFERRING PHYSICIAN.  Exam per Dr.Penland. Flu vaccine today. We will get you set up for an iron infusion. Return as scheduled.  Thank you for choosing [facility] at [facility] to provide your oncology and hematology care.  To afford each patient quality time with our provider, please arrive at least 15 minutes before your scheduled appointment time.    You need to re-schedule your appointment should you arrive 10 or more minutes late.  We strive to give you quality time with our providers, and arriving late affects you and other patients whose appointments are after yours.  Also, if you no show three or more times for appointments you may be dismissed from the clinic at the providers discretion.     Again, thank you for choosing [facility].  Our hope is that these requests will decrease the amount of time that you wait before being seen by our physicians.       _____________________________________________________________  Should you have questions after your visit to [facility], please contact our office at [phone] between the hours of 8:30 a.m. and 4:30 p.m.  Voicemails left after 4:30 p.m. will not be returned until the following business day.  For prescription refill requests, have your pharmacy contact our office.

## 2015-05-04 NOTE — Progress Notes (Signed)
Candice Ross presents today for injection per MD orders. Fluarix administered IM in left Upper Arm. Administration without incident. Patient tolerated well.

## 2015-05-05 ENCOUNTER — Other Ambulatory Visit (HOSPITAL_COMMUNITY): Payer: Self-pay | Admitting: Hematology & Oncology

## 2015-05-05 ENCOUNTER — Telehealth (HOSPITAL_COMMUNITY): Payer: Self-pay | Admitting: Hematology & Oncology

## 2015-05-05 NOTE — Telephone Encounter (Signed)
PC TO [employer] [facility] D10/05/2015 [id number] INJECTAFER DOES NOT REQUIRE AUTH

## 2015-05-05 NOTE — Progress Notes (Signed)
Lab draw

## 2015-05-12 ENCOUNTER — Encounter (HOSPITAL_BASED_OUTPATIENT_CLINIC_OR_DEPARTMENT_OTHER): Payer: BLUE CROSS/BLUE SHIELD

## 2015-05-12 DIAGNOSIS — D509 Iron deficiency anemia, unspecified: Secondary | ICD-10-CM | POA: Diagnosis not present

## 2015-05-12 MED ORDER — SODIUM CHLORIDE 0.9 % IV SOLN
750.0000 mg | Freq: Once | INTRAVENOUS | Status: AC
  Administered 2015-05-12: 750 mg via INTRAVENOUS
  Filled 2015-05-12: qty 100

## 2015-05-12 MED ORDER — SODIUM CHLORIDE 0.9 % IV SOLN
INTRAVENOUS | Status: DC
  Administered 2015-05-12: 13:00:00 via INTRAVENOUS

## 2015-05-12 NOTE — Patient Instructions (Signed)
[facility] Discharge Instructions  RECOMMENDATIONS MADE BY THE CONSULTANT AND ANY TEST RESULTS WILL BE SENT TO YOUR REFERRING PHYSICIAN.  You received IV Iron today.   Ferric carboxymaltose injection What is this medicine? FERRIC CARBOXYMALTOSE (ferr-ik car-box-ee-mol-toes) is an iron complex. Iron is used to make healthy red blood cells, which carry oxygen and nutrients throughout the body. This medicine is used to treat anemia in people with chronic kidney disease or people who cannot take iron by mouth. This medicine may be used for other purposes; ask your health care provider or pharmacist if you have questions. What should I tell my health care provider before I take this medicine? They need to know if you have any of these conditions: -anemia not caused by low iron levels -high levels of iron in the blood -liver disease -an unusual or allergic reaction to iron, other medicines, foods, dyes, or preservatives -pregnant or trying to get pregnant -breast-feeding How should I use this medicine? This medicine is for infusion into a vein. It is given by a health care professional in a hospital or clinic setting. Talk to your pediatrician regarding the use of this medicine in children. Special care may be needed. Overdosage: If you think you have taken too much of this medicine contact a poison control center or emergency room at once. NOTE: This medicine is only for you. Do not share this medicine with others. What if I miss a dose? It is important not to miss your dose. Call your doctor or health care professional if you are unable to keep an appointment. What may interact with this medicine? Do not take this medicine with any of the following medications: -deferoxamine -dimercaprol -other iron products This medicine may also interact with the following medications: -chloramphenicol -deferasirox This list may not describe all possible  interactions. Give your health care provider a list of all the medicines, herbs, non-prescription drugs, or dietary supplements you use. Also tell them if you smoke, drink alcohol, or use illegal drugs. Some items may interact with your medicine. What should I watch for while using this medicine? Visit your doctor or health care professional regularly. Tell your doctor if your symptoms do not start to get better or if they get worse. You may need blood work done while you are taking this medicine. You may need to follow a special diet. Talk to your doctor. Foods that contain iron include: whole grains/cereals, dried fruits, beans, or peas, leafy green vegetables, and organ meats (liver, kidney). What side effects may I notice from receiving this medicine? Side effects that you should report to your doctor or health care professional as soon as possible: -allergic reactions like skin rash, itching or hives, swelling of the face, lips, or tongue -breathing problems -changes in blood pressure -feeling faint or lightheaded, falls -flushing, sweating, or hot feelings Side effects that usually do not require medical attention (Report these to your doctor or health care professional if they continue or are bothersome.): -changes in taste -constipation -dizziness -headache -nausea -pain, redness, or irritation at site where injected -vomiting This list may not describe all possible side effects. Call your doctor for medical advice about side effects. You may report side effects to FDA at 1-800-FDA-1088.    2016, Elsevier/Gold Standard. (2012-02-24 15:36:34)   Thank you for choosing [facility] at [facility] to provide your oncology and hematology care.  To afford each patient quality time with our provider, please  arrive at least 15 minutes before your scheduled appointment time.    You need to re-schedule your appointment should you arrive 10 or more minutes late.  We strive  to give you quality time with our providers, and arriving late affects you and other patients whose appointments are after yours.  Also, if you no show three or more times for appointments you may be dismissed from the clinic at the providers discretion.     Again, thank you for choosing [facility].  Our hope is that these requests will decrease the amount of time that you wait before being seen by our physicians.       _____________________________________________________________  Should you have questions after your visit to [facility], please contact our office at [phone] between the hours of 8:30 a.m. and 4:30 p.m.  Voicemails left after 4:30 p.m. will not be returned until the following business day.  For prescription refill requests, have your pharmacy contact our office.

## 2015-05-19 ENCOUNTER — Other Ambulatory Visit: Payer: Self-pay | Admitting: Radiology

## 2015-06-30 ENCOUNTER — Other Ambulatory Visit (HOSPITAL_COMMUNITY): Payer: Self-pay

## 2015-07-02 ENCOUNTER — Other Ambulatory Visit (HOSPITAL_COMMUNITY): Payer: Self-pay

## 2015-07-03 ENCOUNTER — Other Ambulatory Visit (HOSPITAL_COMMUNITY): Payer: BLUE CROSS/BLUE SHIELD

## 2015-07-15 ENCOUNTER — Other Ambulatory Visit (HOSPITAL_COMMUNITY): Payer: BLUE CROSS/BLUE SHIELD

## 2015-09-03 ENCOUNTER — Encounter (HOSPITAL_COMMUNITY): Payer: BLUE CROSS/BLUE SHIELD | Attending: Hematology & Oncology

## 2015-09-03 DIAGNOSIS — D508 Other iron deficiency anemias: Secondary | ICD-10-CM

## 2015-09-03 DIAGNOSIS — D509 Iron deficiency anemia, unspecified: Secondary | ICD-10-CM | POA: Insufficient documentation

## 2015-09-03 DIAGNOSIS — R5383 Other fatigue: Secondary | ICD-10-CM

## 2015-09-03 DIAGNOSIS — Z9884 Bariatric surgery status: Secondary | ICD-10-CM

## 2015-09-03 LAB — COMPREHENSIVE METABOLIC PANEL
ALT: 12 U/L — ABNORMAL LOW (ref 14–54)
AST: 20 U/L (ref 15–41)
Albumin: 3.7 g/dL (ref 3.5–5.0)
Alkaline Phosphatase: 44 U/L (ref 38–126)
Anion gap: 4 — ABNORMAL LOW (ref 5–15)
BUN: 13 mg/dL (ref 6–20)
CO2: 28 mmol/L (ref 22–32)
Calcium: 8.5 mg/dL — ABNORMAL LOW (ref 8.9–10.3)
Chloride: 106 mmol/L (ref 101–111)
Creatinine, Ser: 0.78 mg/dL (ref 0.44–1.00)
GFR calc Af Amer: >60 mL/min (ref >60)
GFR calc non Af Amer: >60 mL/min (ref >60)
Glucose, Bld: 86 mg/dL (ref 65–99)
Potassium: 4.1 mmol/L (ref 3.5–5.1)
Sodium: 138 mmol/L (ref 135–145)
Total Bilirubin: 0.5 mg/dL (ref 0.3–1.2)
Total Protein: 7.3 g/dL (ref 6.5–8.1)

## 2015-09-03 LAB — CBC WITH DIFFERENTIAL/PLATELET
Basophils Absolute: 0 10*3/uL (ref 0.0–0.1)
Basophils Relative: 0 %
Eosinophils Absolute: 0 10*3/uL (ref 0.0–0.7)
Eosinophils Relative: 0 %
HCT: 37.1 % (ref 36.0–46.0)
Hemoglobin: 11.9 g/dL — ABNORMAL LOW (ref 12.0–15.0)
Lymphocytes Relative: 33 %
Lymphs Abs: 1.6 10*3/uL (ref 0.7–4.0)
MCH: 27.5 pg (ref 26.0–34.0)
MCHC: 32.1 g/dL (ref 30.0–36.0)
MCV: 85.9 fL (ref 78.0–100.0)
Monocytes Absolute: 0.4 10*3/uL (ref 0.1–1.0)
Monocytes Relative: 8 %
Neutro Abs: 2.8 10*3/uL (ref 1.7–7.7)
Neutrophils Relative %: 59 %
Platelets: 172 10*3/uL (ref 150–400)
RBC: 4.32 MIL/uL (ref 3.87–5.11)
RDW: 16.1 % — ABNORMAL HIGH (ref 11.5–15.5)
WBC: 4.9 10*3/uL (ref 4.0–10.5)

## 2015-09-03 LAB — FERRITIN: Ferritin: 5 ng/mL — ABNORMAL LOW (ref 11–307)

## 2015-09-08 ENCOUNTER — Ambulatory Visit (HOSPITAL_COMMUNITY): Payer: BLUE CROSS/BLUE SHIELD

## 2015-09-08 ENCOUNTER — Encounter (HOSPITAL_BASED_OUTPATIENT_CLINIC_OR_DEPARTMENT_OTHER): Payer: BLUE CROSS/BLUE SHIELD

## 2015-09-08 ENCOUNTER — Encounter (HOSPITAL_COMMUNITY): Payer: Self-pay

## 2015-09-08 VITALS — BP 135/79 | HR 57 | Resp 18

## 2015-09-08 DIAGNOSIS — D509 Iron deficiency anemia, unspecified: Secondary | ICD-10-CM

## 2015-09-08 MED ORDER — SODIUM CHLORIDE 0.9 % IV SOLN
INTRAVENOUS | Status: DC
  Administered 2015-09-08: 12:00:00 via INTRAVENOUS

## 2015-09-08 MED ORDER — SODIUM CHLORIDE 0.9 % IV SOLN
Freq: Once | INTRAVENOUS | Status: AC
  Administered 2015-09-08: 12:00:00 via INTRAVENOUS
  Filled 2015-09-08: qty 15

## 2015-09-08 MED ORDER — SODIUM CHLORIDE 0.9% FLUSH: 10.0000 mL | INTRAVENOUS | Status: DC | PRN

## 2015-09-08 NOTE — Progress Notes (Signed)
Patient received injectofer today. Patient tolerated it well.

## 2015-09-08 NOTE — Patient Instructions (Signed)
[facility] at [facility] Discharge Instructions [facility] MADE BY THE CONSULTANT AND ANY TEST RESULTS WILL BE SENT TO YOUR REFERRING PHYSICIAN.  You received IV injectofer today. Will see you at your next appointment.  Thank you for choosing [facility] at [facility] to provide your oncology and hematology care.  To afford each patient quality time with our provider, please arrive at least 15 minutes before your scheduled appointment time.   Beginning January 23rd 2017 lab work for the [facility] will be done in the  Main lab at [facility] on 1st floor. If you have a lab appointment with the Cancer Center please come in thru the  Main Entrance and check in at the main information desk  You need to re-schedule your appointment should you arrive 10 or more minutes late.  We strive to give you quality time with our providers, and arriving late affects you and other patients whose appointments are after yours.  Also, if you no show three or more times for appointments you may be dismissed from the clinic at the providers discretion.     Again, thank you for choosing [facility].  Our hope is that these requests will decrease the amount of time that you wait before being seen by our physicians.       _____________________________________________________________  Should you have questions after your visit to [facility], please contact our office at [phone] between the hours of 8:30 a.m. and 4:30 p.m.  Voicemails left after 4:30 p.m. will not be returned until the following business day.  For prescription refill requests, have your pharmacy contact our office.

## 2015-11-01 ENCOUNTER — Encounter (HOSPITAL_COMMUNITY): Payer: Self-pay | Admitting: Oncology

## 2015-11-01 NOTE — Progress Notes (Signed)
No PCP Per Patient No address on file  Iron deficiency anemia due to chronic blood loss - Plan: Copper, serum  Status post gastric bypass for obesity - Plan: Cyanocobalamin (B-12) 1000 MCG CAPS, Cholecalciferol (VITAMIN D) 2000 units CAPS  CURRENT THERAPY: IV iron replacement as indicated.  INTERVAL HISTORY: Candice Ross 45 y.o. female returns for followup of  Iron deficiency anemia secondary to malabsorption, S/P gastric bypass and on chronic PPI therapy.  Additionally, she has an anastomotic ulcer that was identified on EGD.  Finally, other source of blood loss is menstrual cycles.  I personally reviewed and went over laboratory results with the patient.  The results are noted within this dictation.  Labs will be updated today. Labs today demonstrate a normal white blood cell count of 4.2, hemoglobin 11.3 g/dL, and normal platelet count 180,000. Complete metabolic panel is unimpressive. Of note her potassium is within normal limits. Her ferritin was reported later today and noted to be 12. Based upon her iron deficit calculation, she is a candidate for ferric carboxymaltose area  She notes bilateral leg cramping for the past 2-3 months. From an electrolyte and hematologic standpoint, I cannot explain it other than possibly her iron deficiency. She also notes some fatigue.  Past Medical History  Diagnosis Date  . Ulcerative colitis (HCC)     POSSIBLE  . Anemia   . Iron deficiency anemia due to chronic blood loss 12/31/2013    has Iron deficiency anemia due to chronic blood loss; Abdominal pain, unspecified site; GERD (gastroesophageal reflux disease); Irritable bowel syndrome with constipation; and Status post gastric bypass for obesity on her problem list.     is allergic to morphine and related.  Current Outpatient Prescriptions on File Prior to Visit  Medication Sig Dispense Refill  . Multiple Vitamin (DAILY MULTIVITAMIN PO) Take 1 capsule by mouth daily.      No current facility-administered medications on file prior to visit.    Past Surgical History  Procedure Laterality Date  . [place] bypass      [place] 2006  . Colonoscopy N/A 01/13/2014    Procedure: COLONOSCOPY;  Surgeon: West BaliSandi L Fields, MD;  Location: [facility];  Service: Endoscopy;  Laterality: N/A;  10:30  . Esophagogastroduodenoscopy N/A 01/13/2014    Procedure: ESOPHAGOGASTRODUODENOSCOPY (EGD);  Surgeon: West BaliSandi L Fields, MD;  Location: [facility];  Service: Endoscopy;  Laterality: N/A;    Denies any headaches, dizziness, double vision, fevers, chills, night sweats, nausea, vomiting, diarrhea, constipation, chest pain, heart palpitations, shortness of breath, blood in stool, black tarry stool, urinary pain, urinary burning, urinary frequency, hematuria.   PHYSICAL EXAMINATION  ECOG PERFORMANCE STATUS: 0 - Asymptomatic  Filed Vitals:   11/02/15 1052  BP: 130/76  Pulse: 70  Temp: 97.7 F (36.5 C)  Resp: 18    GENERAL:alert, no distress, well nourished, well developed, comfortable, cooperative, obese, smiling and unaccompanied. SKIN: skin color, texture, turgor are normal, no rashes or significant lesions HEAD: Normocephalic, No masses, lesions, tenderness or abnormalities EYES: normal, EOMI, Conjunctiva are pink and non-injected EARS: External ears normal OROPHARYNX:lips, buccal mucosa, and tongue normal and mucous membranes are moist  NECK: supple, no adenopathy, thyroid normal size, non-tender, without nodularity, trachea midline LYMPH:  no palpable lymphadenopathy BREAST:not examined LUNGS: clear to auscultation and percussion HEART: regular rate & rhythm, no murmurs, no gallops, S1 normal and S2 normal ABDOMEN:abdomen soft, non-tender, obese and normal bowel sounds BACK: Back symmetric, no  curvature. EXTREMITIES:less then 2 second capillary refill, no joint deformities, effusion, or inflammation, no skin discoloration, no cyanosis  NEURO: alert  & oriented x 3 with fluent speech, no focal motor/sensory deficits, gait normal   LABORATORY DATA: CBC    Component Value Date/Time   WBC 4.2 11/02/2015 1035   WBC 4.2 03/07/2013 1546   RBC 3.66* 11/02/2015 1035   RBC 4.13 01/31/2014 1509   RBC 4.30 03/07/2013 1546   HGB 11.3* 11/02/2015 1035   HGB 8.6* 03/07/2013 1546   HCT 34.2* 11/02/2015 1035   HCT 27.9* 03/07/2013 1546   PLT 188 11/02/2015 1035   PLT 299 03/07/2013 1546   MCV 93.4 11/02/2015 1035   MCV 65* 03/07/2013 1546   MCH 30.9 11/02/2015 1035   MCH 20.0* 03/07/2013 1546   MCHC 33.0 11/02/2015 1035   MCHC 30.8* 03/07/2013 1546   RDW 14.8 11/02/2015 1035   RDW 20.7* 03/07/2013 1546   LYMPHSABS 1.5 11/02/2015 1035   MONOABS 0.4 11/02/2015 1035   EOSABS 0.0 11/02/2015 1035   BASOSABS 0.0 11/02/2015 1035      Chemistry      Component Value Date/Time   NA 137 11/02/2015 1035   NA 139 03/07/2013 1546   K 4.2 11/02/2015 1035   K 3.7 03/07/2013 1546   CL 107 11/02/2015 1035   CL 108* 03/07/2013 1546   CO2 27 11/02/2015 1035   CO2 27 03/07/2013 1546   BUN 13 11/02/2015 1035   BUN 10 03/07/2013 1546   CREATININE 0.72 11/02/2015 1035   CREATININE 0.83 03/07/2013 1546      Component Value Date/Time   CALCIUM 8.4* 11/02/2015 1035   CALCIUM 8.7 03/07/2013 1546   ALKPHOS 39 11/02/2015 1035   ALKPHOS 82 03/07/2013 1546   AST 17 11/02/2015 1035   AST 24 03/07/2013 1546   ALT 13* 11/02/2015 1035   ALT 16 03/07/2013 1546   BILITOT 0.4 11/02/2015 1035   BILITOT 0.2 03/07/2013 1546     Lab Results  Component Value Date   IRON 20* 05/04/2015   TIBC 455* 05/04/2015   FERRITIN 12 11/02/2015    PENDING LABS:   RADIOGRAPHIC STUDIES:  No results found.   PATHOLOGY:    ASSESSMENT AND PLAN:  Iron deficiency anemia due to chronic blood loss Iron deficiency anemia secondary to malabsorption, S/P gastric bypass and on chronic PPI therapy.  Additionally, she has an anastomotic ulcer that was identified on  EGD.  Finally, other source of blood loss is menstrual cycles.  Oncology Flowsheet 05/12/2015 09/08/2015  Ferric Carboxymaltose IV [ 750 mg ] [ 750 mg ]    Labs today: CBC diff, CMET, ferritin  Labs in 2 months: CBC diff, CMET, ferritin, TSH, B12, Folate, Vit D, copper level.  Labs in 4 and 6 months: CBC diff, CMET, ferritin.  No indication of chronic renal disease, therefore, we cannot use ferric gluconate.  Based upon her iron deficit calculation, she is a candidate for ferric carboxymaltose. We'll get this set up for her in the near future.  She reports that she has not been taking her oral replacement of B12 and vitamin D. She notes that hasn't been refilled for her in many months. Given her gastric bypass, I think is prudent for her to take these 2 medications. I have escribed refills of both B12 and vitamin D. As mentioned above, we'll check both levels in 2 months with her next laboratory appointment.  Return in 6 months for follow-up.  THERAPY PLAN:  We will continue to monitor her laboratory work in addition to monitoring her iron studies. We are prepared to provide IV iron replacement as indicated.  All questions were answered. The patient knows to call the clinic with any problems, questions or concerns. We can certainly see the patient much sooner if necessary.  Patient and plan discussed with Dr. Loma Messing and she is in agreement with the aforementioned.   This note is electronically signed by: Tina Griffiths 11/02/2015 4:35 PM

## 2015-11-01 NOTE — Assessment & Plan Note (Addendum)
Iron deficiency anemia secondary to malabsorption, S/P gastric bypass and on chronic PPI therapy.  Additionally, she has an anastomotic ulcer that was identified on EGD.  Finally, other source of blood loss is menstrual cycles.  Oncology Flowsheet 05/12/2015 09/08/2015  Ferric Carboxymaltose IV [ 750 mg ] [ 750 mg ]    Labs today: CBC diff, CMET, ferritin  Labs in 2 months: CBC diff, CMET, ferritin, TSH, B12, Folate, Vit D, copper level.  Labs in 4 and 6 months: CBC diff, CMET, ferritin.  No indication of chronic renal disease, therefore, we cannot use ferric gluconate.  Based upon her iron deficit calculation, she is a candidate for ferric carboxymaltose. We'll get this set up for her in the near future.  She reports that she has not been taking her oral replacement of B12 and vitamin D. She notes that hasn't been refilled for her in many months. Given her gastric bypass, I think is prudent for her to take these 2 medications. I have escribed refills of both B12 and vitamin D. As mentioned above, we'll check both levels in 2 months with her next laboratory appointment.  Return in 6 months for follow-up.

## 2015-11-02 ENCOUNTER — Encounter (HOSPITAL_COMMUNITY): Payer: BLUE CROSS/BLUE SHIELD | Attending: Hematology & Oncology

## 2015-11-02 ENCOUNTER — Encounter (HOSPITAL_BASED_OUTPATIENT_CLINIC_OR_DEPARTMENT_OTHER): Payer: BLUE CROSS/BLUE SHIELD | Admitting: Oncology

## 2015-11-02 ENCOUNTER — Ambulatory Visit (HOSPITAL_COMMUNITY): Payer: BLUE CROSS/BLUE SHIELD | Admitting: Hematology & Oncology

## 2015-11-02 ENCOUNTER — Encounter (HOSPITAL_COMMUNITY): Payer: Self-pay | Admitting: Oncology

## 2015-11-02 ENCOUNTER — Ambulatory Visit (HOSPITAL_COMMUNITY): Payer: BLUE CROSS/BLUE SHIELD | Admitting: Oncology

## 2015-11-02 ENCOUNTER — Other Ambulatory Visit (HOSPITAL_COMMUNITY): Payer: BLUE CROSS/BLUE SHIELD

## 2015-11-02 VITALS — BP 130/76 | HR 70 | Temp 97.7°F | Resp 18 | Wt 206.0 lb

## 2015-11-02 DIAGNOSIS — N920 Excessive and frequent menstruation with regular cycle: Secondary | ICD-10-CM | POA: Diagnosis not present

## 2015-11-02 DIAGNOSIS — R5383 Other fatigue: Secondary | ICD-10-CM

## 2015-11-02 DIAGNOSIS — D500 Iron deficiency anemia secondary to blood loss (chronic): Secondary | ICD-10-CM

## 2015-11-02 DIAGNOSIS — D509 Iron deficiency anemia, unspecified: Secondary | ICD-10-CM | POA: Diagnosis not present

## 2015-11-02 DIAGNOSIS — Z9884 Bariatric surgery status: Secondary | ICD-10-CM

## 2015-11-02 DIAGNOSIS — D508 Other iron deficiency anemias: Secondary | ICD-10-CM

## 2015-11-02 LAB — CBC WITH DIFFERENTIAL/PLATELET
Basophils Absolute: 0 10*3/uL (ref 0.0–0.1)
Basophils Relative: 0 %
Eosinophils Absolute: 0 10*3/uL (ref 0.0–0.7)
Eosinophils Relative: 1 %
HCT: 34.2 % — ABNORMAL LOW (ref 36.0–46.0)
Hemoglobin: 11.3 g/dL — ABNORMAL LOW (ref 12.0–15.0)
Lymphocytes Relative: 35 %
Lymphs Abs: 1.5 10*3/uL (ref 0.7–4.0)
MCH: 30.9 pg (ref 26.0–34.0)
MCHC: 33 g/dL (ref 30.0–36.0)
MCV: 93.4 fL (ref 78.0–100.0)
Monocytes Absolute: 0.4 10*3/uL (ref 0.1–1.0)
Monocytes Relative: 10 %
Neutro Abs: 2.3 10*3/uL (ref 1.7–7.7)
Neutrophils Relative %: 54 %
Platelets: 188 10*3/uL (ref 150–400)
RBC: 3.66 MIL/uL — ABNORMAL LOW (ref 3.87–5.11)
RDW: 14.8 % (ref 11.5–15.5)
WBC: 4.2 10*3/uL (ref 4.0–10.5)

## 2015-11-02 LAB — COMPREHENSIVE METABOLIC PANEL
ALT: 13 U/L — ABNORMAL LOW (ref 14–54)
AST: 17 U/L (ref 15–41)
Albumin: 3.9 g/dL (ref 3.5–5.0)
Alkaline Phosphatase: 39 U/L (ref 38–126)
Anion gap: 3 — ABNORMAL LOW (ref 5–15)
BUN: 13 mg/dL (ref 6–20)
CO2: 27 mmol/L (ref 22–32)
Calcium: 8.4 mg/dL — ABNORMAL LOW (ref 8.9–10.3)
Chloride: 107 mmol/L (ref 101–111)
Creatinine, Ser: 0.72 mg/dL (ref 0.44–1.00)
GFR calc Af Amer: >60 mL/min (ref >60)
GFR calc non Af Amer: >60 mL/min (ref >60)
Glucose, Bld: 84 mg/dL (ref 65–99)
Potassium: 4.2 mmol/L (ref 3.5–5.1)
Sodium: 137 mmol/L (ref 135–145)
Total Bilirubin: 0.4 mg/dL (ref 0.3–1.2)
Total Protein: 7.4 g/dL (ref 6.5–8.1)

## 2015-11-02 LAB — FERRITIN: Ferritin: 12 ng/mL (ref 11–307)

## 2015-11-02 MED ORDER — B-12 1000 MCG PO CAPS: 1000.0000 ug | ORAL_CAPSULE | Freq: Every day | ORAL | Status: DC

## 2015-11-02 MED ORDER — VITAMIN D 50 MCG (2000 UT) PO CAPS: 2000.0000 [IU] | ORAL_CAPSULE | Freq: Every day | ORAL | Status: DC

## 2015-11-02 NOTE — Patient Instructions (Signed)
[facility] Discharge Instructions  RECOMMENDATIONS MADE BY THE CONSULTANT AND ANY TEST RESULTS WILL BE SENT TO YOUR REFERRING PHYSICIAN.  Exam done and seen today by Jenita Seashoreom Kefalas Refilled PO b12, and Vit.D Labs every 2 months Return to see the Doctor in 6monts Call the clinic for any concerns or questions.   Thank you for choosing [facility] at [facility] to provide your oncology and hematology care.  To afford each patient quality time with our provider, please arrive at least 15 minutes before your scheduled appointment time.   Beginning January 23rd 2017 [facility] will be done in the  Main lab at [facility] on 1st floor. If you have a lab appointment with the Cancer Center please come in thru the  Main Entrance and check in at the main information desk  You need to re-schedule your appointment should you arrive 10 or more minutes late.  We strive to give you quality time with our providers, and arriving late affects you and other patients whose appointments are after yours.  Also, if you no show three or more times for appointments you may be dismissed from the clinic at the providers discretion.     Again, thank you for choosing [facility].  Our hope is that these requests will decrease the amount of time that you wait before being seen by our physicians.       _____________________________________________________________  Should you have questions after your visit to [facility], please contact our office at [phone] between the hours of 8:30 a.m. and 4:30 p.m.  Voicemails left after 4:30 p.m. will not be returned until the following business day.  For prescription refill requests, have your pharmacy contact our office.         Resources For Cancer Patients and their Caregivers ? American Cancer Society: Can assist with transportation, wigs, general needs, runs Look  Good Feel Better.        [phone] ? Cancer Care: Provides financial assistance, online support groups, medication/co-pay assistance.  1-800-813-HOPE (706)796-7129(4673) ? [facility] Assists [place] cancer patients and their families through emotional , educational and financial support.  [phone] ? [place] DSS Where to apply for food stamps, Medicaid and utility assistance. ([phone] [phone] ? RCATS: Transportation to medical appointments. 815-258-7787(228)884-2623 ? Social Security Administration: May apply for disability if have a Stage IV cancer. [phone] ? [place] [facility], Disability and Transit Services: Assists with nutrition, care and transit needs. [phone]

## 2015-11-04 ENCOUNTER — Encounter (HOSPITAL_COMMUNITY): Payer: Self-pay

## 2015-11-04 ENCOUNTER — Encounter (HOSPITAL_BASED_OUTPATIENT_CLINIC_OR_DEPARTMENT_OTHER): Payer: BLUE CROSS/BLUE SHIELD

## 2015-11-04 VITALS — BP 126/67 | HR 63 | Temp 97.5°F | Resp 20

## 2015-11-04 DIAGNOSIS — D509 Iron deficiency anemia, unspecified: Secondary | ICD-10-CM | POA: Diagnosis not present

## 2015-11-04 DIAGNOSIS — D500 Iron deficiency anemia secondary to blood loss (chronic): Secondary | ICD-10-CM

## 2015-11-04 MED ORDER — FERRIC CARBOXYMALTOSE 750 MG/15ML IV SOLN
750.0000 mg | Freq: Once | INTRAVENOUS | Status: AC
  Administered 2015-11-04: 750 mg via INTRAVENOUS
  Filled 2015-11-04: qty 15

## 2015-11-04 MED ORDER — SODIUM CHLORIDE 0.9 % IV SOLN
INTRAVENOUS | Status: AC
  Administered 2015-11-04: 13:00:00 via INTRAVENOUS

## 2015-11-04 NOTE — Patient Instructions (Signed)
[facility] Discharge Instructions  RECOMMENDATIONS MADE BY THE CONSULTANT AND ANY TEST RESULTS WILL BE SENT TO YOUR REFERRING PHYSICIAN.  You received injectafer today. Will see you at your next visit. Call if you have any concerns or questions.  Thank you for choosing [facility] at [facility] to provide your oncology and hematology care.  To afford each patient quality time with our provider, please arrive at least 15 minutes before your scheduled appointment time.   Beginning January 23rd 2017 lab [facility] the [facility] will be done in the  Main lab at [facility] on 1st floor. If you have a lab appointment with the Cancer Center please come in thru the  Main Entrance and check in at the main information desk  You need to re-schedule your appointment should you arrive 10 or more minutes late.  We strive to give you quality time with our providers, and arriving late affects you and other patients whose appointments are after yours.  Also, if you no show three or more times for appointments you may be dismissed from the clinic at the providers discretion.     Again, thank you for choosing [facility].  Our hope is that these requests will decrease the amount of time that you wait before being seen by our physicians.       _____________________________________________________________  Should you have questions after your visit to [facility], please contact our office at [phone] between the hours of 8:30 a.m. and 4:30 p.m.  Voicemails left after 4:30 p.m. will not be returned until the following business day.  For prescription refill requests, have your pharmacy contact our office.         Resources For Cancer Patients and their Caregivers ? American Cancer Society: Can assist with transportation, wigs, general needs, runs Look Good Feel Better.        [phone] ? Cancer  Care: Provides financial assistance, online support groups, medication/co-pay assistance.  1-800-813-HOPE 724-172-3021(4673) ? [facility] Assists [place] cancer patients and their families through emotional , educational and financial support.  [phone] ? [place] DSS Where to apply for food stamps, Medicaid and utility assistance. [phone] ? RCATS: Transportation to medical appointments. 701-316-2062306-120-7975 ? Social Security Administration: May apply for disability if have a Stage IV cancer. [phone] ? [place] [facility], [facility] and [facility] Services: Assists with nutrition, care and transit needs. [phone]

## 2015-11-04 NOTE — Progress Notes (Unsigned)
Patient received IV injectafer today. Patient tolerated well

## 2015-12-30 ENCOUNTER — Encounter (HOSPITAL_COMMUNITY): Payer: BLUE CROSS/BLUE SHIELD | Attending: Hematology & Oncology

## 2015-12-30 DIAGNOSIS — Z9884 Bariatric surgery status: Secondary | ICD-10-CM

## 2015-12-30 DIAGNOSIS — D509 Iron deficiency anemia, unspecified: Secondary | ICD-10-CM | POA: Insufficient documentation

## 2015-12-30 DIAGNOSIS — D508 Other iron deficiency anemias: Secondary | ICD-10-CM

## 2015-12-30 DIAGNOSIS — R5383 Other fatigue: Secondary | ICD-10-CM

## 2015-12-30 DIAGNOSIS — D500 Iron deficiency anemia secondary to blood loss (chronic): Secondary | ICD-10-CM

## 2015-12-30 LAB — CBC WITH DIFFERENTIAL/PLATELET
Basophils Absolute: 0 10*3/uL (ref 0.0–0.1)
Basophils Relative: 0 %
Eosinophils Absolute: 0 10*3/uL (ref 0.0–0.7)
Eosinophils Relative: 1 %
HCT: 40.1 % (ref 36.0–46.0)
Hemoglobin: 13 g/dL (ref 12.0–15.0)
Lymphocytes Relative: 41 %
Lymphs Abs: 1.6 10*3/uL (ref 0.7–4.0)
MCH: 30 pg (ref 26.0–34.0)
MCHC: 32.4 g/dL (ref 30.0–36.0)
MCV: 92.6 fL (ref 78.0–100.0)
Monocytes Absolute: 0.3 10*3/uL (ref 0.1–1.0)
Monocytes Relative: 7 %
Neutro Abs: 2 10*3/uL (ref 1.7–7.7)
Neutrophils Relative %: 51 %
Platelets: 164 10*3/uL (ref 150–400)
RBC: 4.33 MIL/uL (ref 3.87–5.11)
RDW: 13.9 % (ref 11.5–15.5)
WBC: 3.9 10*3/uL — ABNORMAL LOW (ref 4.0–10.5)

## 2015-12-30 LAB — COMPREHENSIVE METABOLIC PANEL
ALT: 13 U/L — ABNORMAL LOW (ref 14–54)
AST: 21 U/L (ref 15–41)
Albumin: 3.7 g/dL (ref 3.5–5.0)
Alkaline Phosphatase: 46 U/L (ref 38–126)
Anion gap: 6 (ref 5–15)
BUN: 11 mg/dL (ref 6–20)
CO2: 24 mmol/L (ref 22–32)
Calcium: 8.5 mg/dL — ABNORMAL LOW (ref 8.9–10.3)
Chloride: 109 mmol/L (ref 101–111)
Creatinine, Ser: 0.82 mg/dL (ref 0.44–1.00)
GFR calc Af Amer: >60 mL/min (ref >60)
GFR calc non Af Amer: >60 mL/min (ref >60)
Glucose, Bld: 109 mg/dL — ABNORMAL HIGH (ref 65–99)
Potassium: 3.3 mmol/L — ABNORMAL LOW (ref 3.5–5.1)
Sodium: 139 mmol/L (ref 135–145)
Total Bilirubin: 0.4 mg/dL (ref 0.3–1.2)
Total Protein: 7.1 g/dL (ref 6.5–8.1)

## 2015-12-30 LAB — VITAMIN B12: Vitamin B-12: 239 pg/mL (ref 180–914)

## 2015-12-30 LAB — FERRITIN: Ferritin: 54 ng/mL (ref 11–307)

## 2015-12-30 LAB — TSH: TSH: 1.708 u[IU]/mL (ref 0.350–4.500)

## 2015-12-31 LAB — FOLATE: Folate: 32.5 ng/mL (ref >5.9)

## 2016-01-01 ENCOUNTER — Other Ambulatory Visit (HOSPITAL_COMMUNITY): Payer: BLUE CROSS/BLUE SHIELD

## 2016-01-04 ENCOUNTER — Other Ambulatory Visit (HOSPITAL_COMMUNITY): Payer: Self-pay | Admitting: Oncology

## 2016-01-06 LAB — COPPER, SERUM: Copper: 90 ug/dL (ref 72–166)

## 2016-01-06 LAB — VITAMIN D 25 HYDROXY (VIT D DEFICIENCY, FRACTURES): Vit D, 25-Hydroxy: 24.6 ng/mL — ABNORMAL LOW (ref 30.0–100.0)

## 2016-03-02 ENCOUNTER — Other Ambulatory Visit (HOSPITAL_COMMUNITY): Payer: BLUE CROSS/BLUE SHIELD

## 2016-05-03 ENCOUNTER — Encounter (HOSPITAL_COMMUNITY): Payer: Self-pay | Admitting: Hematology & Oncology

## 2016-05-03 ENCOUNTER — Encounter (HOSPITAL_COMMUNITY): Payer: BLUE CROSS/BLUE SHIELD

## 2016-05-03 ENCOUNTER — Encounter (HOSPITAL_COMMUNITY): Payer: BLUE CROSS/BLUE SHIELD | Attending: Hematology & Oncology | Admitting: Hematology & Oncology

## 2016-05-03 VITALS — BP 116/79 | HR 68 | Temp 98.6°F | Resp 16 | Wt 208.6 lb

## 2016-05-03 DIAGNOSIS — Z23 Encounter for immunization: Secondary | ICD-10-CM

## 2016-05-03 DIAGNOSIS — E559 Vitamin D deficiency, unspecified: Secondary | ICD-10-CM | POA: Diagnosis not present

## 2016-05-03 DIAGNOSIS — Z9884 Bariatric surgery status: Secondary | ICD-10-CM

## 2016-05-03 DIAGNOSIS — R5383 Other fatigue: Secondary | ICD-10-CM

## 2016-05-03 DIAGNOSIS — R7989 Other specified abnormal findings of blood chemistry: Secondary | ICD-10-CM

## 2016-05-03 DIAGNOSIS — D500 Iron deficiency anemia secondary to blood loss (chronic): Secondary | ICD-10-CM | POA: Diagnosis not present

## 2016-05-03 DIAGNOSIS — D508 Other iron deficiency anemias: Secondary | ICD-10-CM

## 2016-05-03 DIAGNOSIS — Z0000 Encounter for general adult medical examination without abnormal findings: Secondary | ICD-10-CM

## 2016-05-03 DIAGNOSIS — E538 Deficiency of other specified B group vitamins: Secondary | ICD-10-CM

## 2016-05-03 LAB — CBC WITH DIFFERENTIAL/PLATELET
Basophils Absolute: 0 10*3/uL (ref 0.0–0.1)
Basophils Relative: 0 %
Eosinophils Absolute: 0 10*3/uL (ref 0.0–0.7)
Eosinophils Relative: 0 %
HCT: 41.2 % (ref 36.0–46.0)
Hemoglobin: 13.5 g/dL (ref 12.0–15.0)
Lymphocytes Relative: 39 %
Lymphs Abs: 1.6 10*3/uL (ref 0.7–4.0)
MCH: 30.6 pg (ref 26.0–34.0)
MCHC: 32.8 g/dL (ref 30.0–36.0)
MCV: 93.4 fL (ref 78.0–100.0)
Monocytes Absolute: 0.3 10*3/uL (ref 0.1–1.0)
Monocytes Relative: 7 %
Neutro Abs: 2.2 10*3/uL (ref 1.7–7.7)
Neutrophils Relative %: 54 %
Platelets: 189 10*3/uL (ref 150–400)
RBC: 4.41 MIL/uL (ref 3.87–5.11)
RDW: 13.2 % (ref 11.5–15.5)
WBC: 4.1 10*3/uL (ref 4.0–10.5)

## 2016-05-03 LAB — COMPREHENSIVE METABOLIC PANEL
ALT: 12 U/L — ABNORMAL LOW (ref 14–54)
AST: 20 U/L (ref 15–41)
Albumin: 4.1 g/dL (ref 3.5–5.0)
Alkaline Phosphatase: 42 U/L (ref 38–126)
Anion gap: 5 (ref 5–15)
BUN: 14 mg/dL (ref 6–20)
CO2: 26 mmol/L (ref 22–32)
Calcium: 8.9 mg/dL (ref 8.9–10.3)
Chloride: 104 mmol/L (ref 101–111)
Creatinine, Ser: 0.82 mg/dL (ref 0.44–1.00)
GFR calc Af Amer: >60 mL/min (ref >60)
GFR calc non Af Amer: >60 mL/min (ref >60)
Glucose, Bld: 89 mg/dL (ref 65–99)
Potassium: 4.3 mmol/L (ref 3.5–5.1)
Sodium: 135 mmol/L (ref 135–145)
Total Bilirubin: 0.7 mg/dL (ref 0.3–1.2)
Total Protein: 7.9 g/dL (ref 6.5–8.1)

## 2016-05-03 LAB — FERRITIN: Ferritin: 60 ng/mL (ref 11–307)

## 2016-05-03 MED ORDER — INFLUENZA VAC SPLIT QUAD 0.5 ML IM SUSY
0.5000 mL | PREFILLED_SYRINGE | Freq: Once | INTRAMUSCULAR | Status: AC
  Administered 2016-05-03: 0.5 mL via INTRAMUSCULAR
  Filled 2016-05-03: qty 0.5

## 2016-05-03 NOTE — Progress Notes (Signed)
DIAGNOSIS: Iron deficiency anemia secondary to malabsorption, status post gastric bypass Anastomotic ulcer found on EGD, chronic PPI therapy Pre-menopausal  CURRENT THERAPY: IV iron replacement prn  INTERVAL HISTORY: Candice Ross 45 y.o. female returns for  follow-up of iron deficiency. She has required quite a bit of IV iron, last being given however in April.   Patient has been feeling really well. She denies abdominal pain or neuropathy in upper and lower extremities. Her appetite is normal.   No pagophagia, no pica.  Energy is excellent. No BRBPR. No abdominal pain. Candice Ross is up to date on her mammograms.    MEDICAL HISTORY: Past Medical History:  Diagnosis Date  . Anemia   . Iron deficiency anemia due to chronic blood loss 12/31/2013  . Ulcerative colitis (HCC)    POSSIBLE    has Iron deficiency anemia due to chronic blood loss; Abdominal pain, unspecified site; GERD (gastroesophageal reflux disease); Irritable bowel syndrome with constipation; and Status post gastric bypass for obesity on her problem list.     is allergic to morphine and related.  We administered Influenza vac split quadrivalent PF.  SURGICAL HISTORY: Past Surgical History:  Procedure Laterality Date  . COLONOSCOPY N/A 01/13/2014   Procedure: COLONOSCOPY;  Surgeon: West BaliSandi L Fields, MD;  [facility];  Service: Endoscopy;  Laterality: N/A;  10:30  . ESOPHAGOGASTRODUODENOSCOPY N/A 01/13/2014   Procedure: ESOPHAGOGASTRODUODENOSCOPY (EGD);  Surgeon: West BaliSandi L Fields, MD;  Location: [facility];  Service: Endoscopy;  Laterality: N/A;  . [place]AROSCOPIC GASTRIC BYPASS     [place] 2006    SOCIAL HISTORY: Social History   Social History  . Marital status: Divorced    Spouse name: N/A  . Number of children: N/A  . Years of education: N/A   Occupational History  . Caterer   . Unit Director for [employer]    Social History Main Topics  . Smoking status: Never Smoker    . Smokeless tobacco: Never Used  . Alcohol use No  . Drug use: No  . Sexual activity: Yes    Birth control/ protection: IUD   Other Topics Concern  . Not on file   Social History Narrative  . No narrative on file    FAMILY HISTORY: Family History  Problem Relation Age of Onset  . Colon cancer Neg Hx     Review of Systems  Constitutional: Negative for chills, fever and weight loss.  HENT: Negative for congestion, hearing loss, nosebleeds, sore throat and tinnitus.   Eyes: Negative for blurred vision, double vision, pain and discharge.  Respiratory: Negative for cough, hemoptysis, sputum production, shortness of breath and wheezing.   Cardiovascular: Negative for chest pain, palpitations, claudication, leg swelling and PND.  Gastrointestinal: Negative for abdominal pain, blood in stool, constipation, diarrhea, heartburn, melena, nausea and vomiting.  Genitourinary: Negative for dysuria, frequency, hematuria and urgency.  Musculoskeletal: Negative for falls and myalgias.  Skin: Negative for itching and rash.  Neurological: Negative for dizziness, tingling, tremors, sensory change, speech change, focal weakness, seizures, loss of consciousness, weakness and headaches.  Endo/Heme/Allergies: Does not bruise/bleed easily.  Psychiatric/Behavioral: Negative for depression, memory loss, substance abuse and suicidal ideas. The patient is not nervous/anxious and does not have insomnia.   14 point review of systems was performed and is negative except as detailed under history of present illness and above  PHYSICAL EXAMINATION  ECOG PERFORMANCE STATUS: 0 - Asymptomatic  Vitals:   05/03/16 1058  BP: 116/79  Pulse: 68  Resp: 16  Temp: 98.6 F (37 C)    Physical Exam  Constitutional: She is oriented to person, place, and time and well-developed, well-nourished, and in no distress.  HENT:  Head: Normocephalic and atraumatic.  Nose: Nose normal.  Mouth/Throat: Oropharynx is clear  and moist. No oropharyngeal exudate.  Eyes: Conjunctivae and EOM are normal. Pupils are equal, round, and reactive to light. Right eye exhibits no discharge. Left eye exhibits no discharge. No scleral icterus.  Neck: Normal range of motion. Neck supple. No tracheal deviation present. No thyromegaly present.  Cardiovascular: Normal rate, regular rhythm and normal heart sounds.  Exam reveals no gallop and no friction rub.   No murmur heard. Pulmonary/Chest: Effort normal and breath sounds normal. She has no wheezes. She has no rales.  Abdominal: Soft. Bowel sounds are normal. She exhibits no distension and no mass. There is no tenderness. There is no rebound and no guarding.  Musculoskeletal: Normal range of motion. She exhibits no edema.  Lymphadenopathy:    She has no cervical adenopathy.  Neurological: She is alert and oriented to person, place, and time. She has normal reflexes. No cranial nerve deficit. Gait normal. Coordination normal.  Skin: Skin is warm and dry. No rash noted.  Psychiatric: Mood, memory, affect and judgment normal.  Nursing note and vitals reviewed.   LABORATORY DATA: I have reviewed the labs below.  CBC    Component Value Date/Time   WBC 4.1 05/03/2016 1033   RBC 4.41 05/03/2016 1033   HGB 13.5 05/03/2016 1033   HGB 8.6 (L) 03/07/2013 1546   HCT 41.2 05/03/2016 1033   HCT 27.9 (L) 03/07/2013 1546   PLT 189 05/03/2016 1033   PLT 299 03/07/2013 1546   MCV 93.4 05/03/2016 1033   MCV 65 (L) 03/07/2013 1546   MCH 30.6 05/03/2016 1033   MCHC 32.8 05/03/2016 1033   RDW 13.2 05/03/2016 1033   RDW 20.7 (H) 03/07/2013 1546   LYMPHSABS 1.6 05/03/2016 1033   MONOABS 0.3 05/03/2016 1033   EOSABS 0.0 05/03/2016 1033   BASOSABS 0.0 05/03/2016 1033     CMP     Component Value Date/Time   NA 135 05/03/2016 1033   NA 139 03/07/2013 1546   K 4.3 05/03/2016 1033   K 3.7 03/07/2013 1546   CL 104 05/03/2016 1033   CL 108 (H) 03/07/2013 1546   CO2 26 05/03/2016  1033   CO2 27 03/07/2013 1546   GLUCOSE 89 05/03/2016 1033   GLUCOSE 100 (H) 03/07/2013 1546   BUN 14 05/03/2016 1033   BUN 10 03/07/2013 1546   CREATININE 0.82 05/03/2016 1033   CREATININE 0.83 03/07/2013 1546   CALCIUM 8.9 05/03/2016 1033   CALCIUM 8.7 03/07/2013 1546   PROT 7.9 05/03/2016 1033   PROT 8.6 (H) 03/07/2013 1546   ALBUMIN 4.1 05/03/2016 1033   ALBUMIN 3.4 03/07/2013 1546   AST 20 05/03/2016 1033   AST 24 03/07/2013 1546   ALT 12 (L) 05/03/2016 1033   ALT 16 03/07/2013 1546   ALKPHOS 42 05/03/2016 1033   ALKPHOS 82 03/07/2013 1546   BILITOT 0.7 05/03/2016 1033   BILITOT 0.2 03/07/2013 1546   GFRNONAA >60 05/03/2016 1033   GFRNONAA >60 03/07/2013 1546   GFRAA >60 05/03/2016 1033   GFRAA >60 03/07/2013 1546   Results for Molter, Evalette CAROL PEATROSS (MRN [id number]) as of 05/04/2016 16:17  Ref. Range 12/30/2015 13:48  Vitamin B12 Latest Ref Range: 180 - 914 pg/mL 239  ASSESSMENT and THERAPY PLAN:   Iron deficiency secondary to malabsorption from gastric bypass Iron deficiency anemia secondary to above Low serum B12 Vitamin D insufficiency  45 year old female with iron deficiency anemia. She has evidence of iron malabsorption which is secondary to prior gastric bypass. Her menstrual cycles have been heavier. I have recommended ongoing 6 month follow-up.  Patient appears well. Her blood counts are normal- hemoglobin today is 13.5. Ferritin has not yet resulted, but I will call her when they do.   In June, Candice Ross's lab workup indicated she had low serum B12. She is currently on oral B12. We will recheck at followup. She also had vitamin D insufficiency she is on replacement as well. We will recheck this at follow-up as well.   All questions were answered. The patient knows to call the clinic with any problems, questions or concerns. We can certainly see the patient much sooner if necessary.  This document serves as a record of services personally performed by  Loma Messing, MD. It was created on her behalf by Silvano Bilis, a trained medical scribe. The creation of this record is based on the scribe's personal observations and the provider's statements to them. This document has been checked and approved by the attending provider.  I have reviewed the above documentation for accuracy and completeness, and I agree with the above.  This note was electronically signed.  Novella Olive. Bow Buntyn, MD  05/03/2016

## 2016-05-03 NOTE — Patient Instructions (Addendum)
[facility] at [facility] Discharge Instructions  RECOMMENDATIONS MADE BY THE CONSULTANT AND ANY TEST RESULTS WILL BE SENT TO YOUR REFERRING PHYSICIAN.  You saw Dr. Galen ManilaPenland today. You had flu shot today. Follow up in 6 months with lab work.  Thank you for choosing [facility] at [facility] to provide your oncology and hematology care.  To afford each patient quality time with our provider, please arrive at least 15 minutes before your scheduled appointment time.   Beginning January 23rd 2017 lab work for the [facility] will be done in the  Main lab at [facility] on 1st floor. If you have a lab appointment with the Cancer Center please come in thru the  Main Entrance and check in at the main information desk  You need to re-schedule your appointment should you arrive 10 or more minutes late.  We strive to give you quality time with our providers, and arriving late affects you and other patients whose appointments are after yours.  Also, if you no show three or more times for appointments you may be dismissed from the clinic at the providers discretion.     Again, thank you for choosing [facility].  Our hope is that these requests will decrease the amount of time that you wait before being seen by our physicians.       _____________________________________________________________  Should you have questions after your visit to [facility], please contact our office at [phone] between the hours of 8:30 a.m. and 4:30 p.m.  Voicemails left after 4:30 p.m. will not be returned until the following business day.  For prescription refill requests, have your pharmacy contact our office.         Resources For Cancer Patients and their Caregivers ? American Cancer Society: Can assist with transportation, wigs, general needs, runs Look Good Feel Better.        [phone] ? Cancer Care: Provides financial  assistance, online support groups, medication/co-pay assistance.  1-800-813-HOPE (609)764-3232(4673) ? [facility] Assists [place] cancer patients and their families through emotional , educational and financial support.  626 [phone] ? [place] DSS Where to apply for food stamps, Medicaid and utility assistance. ([phone] ? RCATS: Transportation to medical appointments. ([phone] ? Social Security Administration: May apply for disability if have a Stage IV cancer. [phone] ? [place] Aging, Disability and Transit Services: Assists with nutrition, care and transit needs. [phone]  Cancer Center Support Programs: @10RELATIVEDAYS @ > Cancer Support Group  2nd Tuesday of the month 1pm-2pm, Journey Room  > Creative Journey  3rd Tuesday of the month 1130am-1pm, Journey Room  > Look Good Feel Better  1st Wednesday of the month 10am-12 noon, Journey Room (Call American Cancer Society to register [phone])   Influenza Virus Vaccine injection (Fluarix) What is this medicine? INFLUENZA VIRUS VACCINE (in floo EN zuh VAHY ruhs vak SEEN) helps to reduce the risk of getting influenza also known as the flu. This medicine may be used for other purposes; ask your health care provider or pharmacist if you have questions. What should I tell my health care provider before I take this medicine? They need to know if you have any of these conditions: -bleeding disorder like hemophilia -fever or infection -Guillain-Barre syndrome or other neurological problems -immune system problems -infection with the human immunodeficiency virus (HIV) or AIDS -low blood platelet counts -multiple sclerosis -an unusual or allergic reaction to influenza  virus vaccine, eggs, chicken proteins, latex, gentamicin, other medicines, foods, dyes or preservatives -pregnant or trying to get pregnant -breast-feeding How should I use this medicine? This vaccine is for  injection into a muscle. It is given by a health care professional. A copy of Vaccine Information Statements will be given before each vaccination. Read this sheet carefully each time. The sheet may change frequently. Talk to your pediatrician regarding the use of this medicine in children. Special care may be needed. Overdosage: If you think you have taken too much of this medicine contact a poison control center or emergency room at once. NOTE: This medicine is only for you. Do not share this medicine with others. What if I miss a dose? This does not apply. What may interact with this medicine? -chemotherapy or radiation therapy -medicines that lower your immune system like etanercept, anakinra, infliximab, and adalimumab -medicines that treat or prevent blood clots like warfarin -phenytoin -steroid medicines like prednisone or cortisone -theophylline -vaccines This list may not describe all possible interactions. Give your health care provider a list of all the medicines, herbs, non-prescription drugs, or dietary supplements you use. Also tell them if you smoke, drink alcohol, or use illegal drugs. Some items may interact with your medicine. What should I watch for while using this medicine? Report any side effects that do not go away within 3 days to your doctor or health care professional. Call your health care provider if any unusual symptoms occur within 6 weeks of receiving this vaccine. You may still catch the flu, but the illness is not usually as bad. You cannot get the flu from the vaccine. The vaccine will not protect against colds or other illnesses that may cause fever. The vaccine is needed every year. What side effects may I notice from receiving this medicine? Side effects that you should report to your doctor or health care professional as soon as possible: -allergic reactions like skin rash, itching or hives, swelling of the face, lips, or tongue Side effects that usually do  not require medical attention (report to your doctor or health care professional if they continue or are bothersome): -fever -headache -muscle aches and pains -pain, tenderness, redness, or swelling at site where injected -weak or tired This list may not describe all possible side effects. Call your doctor for medical advice about side effects. You may report side effects to FDA at 1-800-FDA-1088. Where should I keep my medicine? This vaccine is only given in a clinic, pharmacy, doctor's office, or other health care setting and will not be stored at home. NOTE: This sheet is a summary. It may not cover all possible information. If you have questions about this medicine, talk to your doctor, pharmacist, or health care provider.    2016, Elsevier/Gold Standard. (2008-02-06 09:30:40)

## 2016-05-03 NOTE — Progress Notes (Signed)
Candice Ross presents today for injection per MD orders. Flu Vaccine administered IM in right Upper Arm. Administration without incident. Patient tolerated well.

## 2016-05-04 ENCOUNTER — Encounter (HOSPITAL_COMMUNITY): Payer: Self-pay | Admitting: Hematology & Oncology

## 2016-05-04 DIAGNOSIS — E559 Vitamin D deficiency, unspecified: Secondary | ICD-10-CM | POA: Insufficient documentation

## 2016-05-04 DIAGNOSIS — E538 Deficiency of other specified B group vitamins: Secondary | ICD-10-CM | POA: Insufficient documentation

## 2016-05-05 ENCOUNTER — Other Ambulatory Visit (HOSPITAL_COMMUNITY): Payer: Self-pay | Admitting: Hematology & Oncology

## 2016-05-11 ENCOUNTER — Encounter (HOSPITAL_BASED_OUTPATIENT_CLINIC_OR_DEPARTMENT_OTHER): Payer: BLUE CROSS/BLUE SHIELD

## 2016-05-11 VITALS — BP 116/68 | HR 73 | Temp 97.3°F | Resp 16

## 2016-05-11 DIAGNOSIS — D509 Iron deficiency anemia, unspecified: Secondary | ICD-10-CM | POA: Diagnosis not present

## 2016-05-11 DIAGNOSIS — D500 Iron deficiency anemia secondary to blood loss (chronic): Secondary | ICD-10-CM

## 2016-05-11 MED ORDER — FERRIC CARBOXYMALTOSE 750 MG/15ML IV SOLN
750.0000 mg | Freq: Once | INTRAVENOUS | Status: AC
  Administered 2016-05-11: 750 mg via INTRAVENOUS
  Filled 2016-05-11: qty 15

## 2016-05-11 MED ORDER — SODIUM CHLORIDE 0.9 % IV SOLN
INTRAVENOUS | Status: DC
  Administered 2016-05-11: 15:00:00 via INTRAVENOUS

## 2016-05-11 NOTE — Patient Instructions (Signed)
[facility] at [facility] Discharge Instructions  RECOMMENDATIONS MADE BY THE CONSULTANT AND ANY TEST RESULTS WILL BE SENT TO YOUR REFERRING PHYSICIAN.  Received Injectafer today. Follow-up as scheduled. Call clinic for any questions or concerns  Thank you for choosing [facility] at [facility] to provide your oncology and hematology care.  To afford each patient quality time with our provider, please arrive at least 15 minutes before your scheduled appointment time.   Beginning January 23rd 2017 lab work for the Cancer Center will be done in the  Main lab at [facility] on 1st floor. If you have a lab appointment with the Cancer Center please come in thru the  Main Entrance and check in at the main information desk  You need to re-schedule your appointment should you arrive 10 or more minutes late.  We strive to give you quality time with our providers, and arriving late affects you and other patients whose appointments are after yours.  Also, if you no show three or more times for appointments you may be dismissed from the clinic at the providers discretion.     Again, thank you for choosing [facility].  Our hope is that these requests will decrease the amount of time that you wait before being seen by our physicians.       _____________________________________________________________  Should you have questions after your visit to [facility], please contact our office at [phone] between the hours of 8:30 a.m. and 4:30 p.m.  Voicemails left after 4:30 p.m. will not be returned until the following business day.  For prescription refill requests, have your pharmacy contact our office.         Resources For Cancer Patients and their Caregivers ? American Cancer Society: Can assist with transportation, wigs, general needs, runs Look Good Feel Better.        1-888-227-6333 ? Cancer Care: Provides financial  assistance, online support groups, medication/co-pay assistance.  1-800-813-HOPE (4673) ? [facility] Assists [place] cancer patients and their families through emotional , educational and financial support.  336-427-4357 ? [place] DSS Where to apply for food stamps, Medicaid and utility assistance. 336-342-1394 ? RCATS: Transportation to medical appointments. 336-347-2287 ? Social Security Administration: May apply for disability if have a Stage IV cancer. 336-342-7796 1-800-772-1213 ? [place] Aging, Disability and Transit Services: Assists with nutrition, care and transit needs. 336-349-2343  Cancer Center Support Programs: @10RELATIVEDAYS@ > Cancer Support Group  2nd Tuesday of the month 1pm-2pm, Journey Room  > Creative Journey  3rd Tuesday of the month 1130am-1pm, Journey Room  > Look Good Feel Better  1st Wednesday of the month 10am-12 noon, Journey Room (Call American Cancer Society to register 1-800-395-5775)   

## 2016-05-11 NOTE — Progress Notes (Signed)
Candice BallsLinda Carol Peatross Ross tolerated Injectafer well without complaints or incident. VSS upon discharge. Pt discharged self ambulatory in satisfactory condition

## 2016-08-29 DIAGNOSIS — J069 Acute upper respiratory infection, unspecified: Secondary | ICD-10-CM | POA: Diagnosis not present

## 2016-09-27 ENCOUNTER — Other Ambulatory Visit (HOSPITAL_COMMUNITY): Payer: Self-pay | Admitting: Oncology

## 2016-09-27 ENCOUNTER — Encounter (HOSPITAL_COMMUNITY): Payer: BLUE CROSS/BLUE SHIELD | Attending: Oncology

## 2016-09-27 DIAGNOSIS — R5383 Other fatigue: Secondary | ICD-10-CM | POA: Diagnosis not present

## 2016-09-27 DIAGNOSIS — D508 Other iron deficiency anemias: Secondary | ICD-10-CM

## 2016-09-27 DIAGNOSIS — Z9884 Bariatric surgery status: Secondary | ICD-10-CM | POA: Insufficient documentation

## 2016-09-27 LAB — CBC WITH DIFFERENTIAL/PLATELET
Basophils Absolute: 0 10*3/uL (ref 0.0–0.1)
Basophils Relative: 0 %
Eosinophils Absolute: 0 10*3/uL (ref 0.0–0.7)
Eosinophils Relative: 1 %
HCT: 39 % (ref 36.0–46.0)
Hemoglobin: 12.8 g/dL (ref 12.0–15.0)
Lymphocytes Relative: 41 %
Lymphs Abs: 1.7 10*3/uL (ref 0.7–4.0)
MCH: 30.6 pg (ref 26.0–34.0)
MCHC: 32.8 g/dL (ref 30.0–36.0)
MCV: 93.3 fL (ref 78.0–100.0)
Monocytes Absolute: 0.3 10*3/uL (ref 0.1–1.0)
Monocytes Relative: 7 %
Neutro Abs: 2.1 10*3/uL (ref 1.7–7.7)
Neutrophils Relative %: 51 %
Platelets: 174 10*3/uL (ref 150–400)
RBC: 4.18 MIL/uL (ref 3.87–5.11)
RDW: 12.9 % (ref 11.5–15.5)
WBC: 4.1 10*3/uL (ref 4.0–10.5)

## 2016-09-28 LAB — FERRITIN: Ferritin: 275 ng/mL (ref 11–307)

## 2016-11-01 ENCOUNTER — Other Ambulatory Visit (HOSPITAL_COMMUNITY): Payer: BLUE CROSS/BLUE SHIELD

## 2016-11-01 ENCOUNTER — Ambulatory Visit (HOSPITAL_COMMUNITY): Payer: BLUE CROSS/BLUE SHIELD

## 2016-11-04 ENCOUNTER — Other Ambulatory Visit (HOSPITAL_COMMUNITY): Payer: Self-pay | Admitting: Oncology

## 2016-11-04 DIAGNOSIS — Z9884 Bariatric surgery status: Secondary | ICD-10-CM

## 2017-01-17 DIAGNOSIS — Z1151 Encounter for screening for human papillomavirus (HPV): Secondary | ICD-10-CM | POA: Diagnosis not present

## 2017-01-17 DIAGNOSIS — Z01419 Encounter for gynecological examination (general) (routine) without abnormal findings: Secondary | ICD-10-CM | POA: Diagnosis not present

## 2017-01-17 DIAGNOSIS — Z1231 Encounter for screening mammogram for malignant neoplasm of breast: Secondary | ICD-10-CM | POA: Diagnosis not present

## 2017-01-17 DIAGNOSIS — Z6832 Body mass index (BMI) 32.0-32.9, adult: Secondary | ICD-10-CM | POA: Diagnosis not present

## 2017-01-17 DIAGNOSIS — R87810 Cervical high risk human papillomavirus (HPV) DNA test positive: Secondary | ICD-10-CM | POA: Diagnosis not present

## 2017-02-22 DIAGNOSIS — Z30433 Encounter for removal and reinsertion of intrauterine contraceptive device: Secondary | ICD-10-CM | POA: Diagnosis not present

## 2017-05-04 ENCOUNTER — Encounter: Payer: Self-pay | Admitting: Podiatry

## 2017-05-04 ENCOUNTER — Ambulatory Visit (INDEPENDENT_AMBULATORY_CARE_PROVIDER_SITE_OTHER): Payer: BLUE CROSS/BLUE SHIELD | Admitting: Podiatry

## 2017-05-04 VITALS — BP 135/88 | HR 55

## 2017-05-04 DIAGNOSIS — M2142 Flat foot [pes planus] (acquired), left foot: Secondary | ICD-10-CM

## 2017-05-04 DIAGNOSIS — M2141 Flat foot [pes planus] (acquired), right foot: Secondary | ICD-10-CM | POA: Diagnosis not present

## 2017-05-04 DIAGNOSIS — L989 Disorder of the skin and subcutaneous tissue, unspecified: Secondary | ICD-10-CM

## 2017-05-04 DIAGNOSIS — Q828 Other specified congenital malformations of skin: Secondary | ICD-10-CM | POA: Diagnosis not present

## 2017-05-04 DIAGNOSIS — M2010 Hallux valgus (acquired), unspecified foot: Secondary | ICD-10-CM

## 2017-05-04 NOTE — Progress Notes (Addendum)
   Subjective:    Patient ID: Candice Ross, female    DOB: 1970/10/16, 46 y.o.   MRN: [id number]  HPI this patient presents the office with chief complaint of a painful callus on the bottom of her right foot.  This patient states that she has had this lesion  for years.  Resolve. She says that when the callus becomes painful. She applies acid, which helps the pain to resolve.  She says the callus is coming back more frequently and she presents the office today for an evaluation of this painful skin lesion.    Review of Systems  All other systems reviewed and are negative.      Objective:   Physical Exam General Appearance  Alert, conversant and in no acute stress.  Vascular  Dorsalis pedis and posterior pulses are palpable  bilaterally.  Capillary return is within normal limits  Bilaterally. Temperature is within normal limits  Bilaterally  Neurologic  Senn-Weinstein monofilament wire test within normal limits  bilaterally. Muscle power  Within normal limits bilaterally.  Nails Normal nails noted with no evidence of bacterial  infection or fungal infection  Orthopedic  No limitations of motion of motion feet bilaterally.  No crepitus or effusions noted.  No bony pathology or digital deformities noted. Pes planus  B/L  HAV  B/L.  Skin   porokeratosis noted sub 4th metatarsal right foot..  No signs of infections or ulcers noted.          Assessment & Plan:  Benign skin lesion  Porokeratosis  Right foot.   IE  Debride porokeratosis.  Discussed porokeratosis with this patient.  Told her this is benign skin lesion that I will do bride at the visit today and hopefully it will last for months.  If the skin lesion, returns quickly, we will consider curettage of the lesion. Patient was told to discontinue her acid usage. Prescribed spenco 3/4 orthotic.   Helane Gunther DPM

## 2017-06-07 DIAGNOSIS — H524 Presbyopia: Secondary | ICD-10-CM | POA: Diagnosis not present

## 2017-06-07 DIAGNOSIS — H5203 Hypermetropia, bilateral: Secondary | ICD-10-CM | POA: Diagnosis not present

## 2017-06-28 ENCOUNTER — Other Ambulatory Visit (HOSPITAL_COMMUNITY): Payer: Self-pay | Admitting: *Deleted

## 2017-06-28 ENCOUNTER — Encounter (HOSPITAL_COMMUNITY): Payer: BLUE CROSS/BLUE SHIELD

## 2017-06-28 ENCOUNTER — Other Ambulatory Visit: Payer: Self-pay

## 2017-06-28 ENCOUNTER — Encounter (HOSPITAL_COMMUNITY): Payer: BLUE CROSS/BLUE SHIELD | Attending: Oncology | Admitting: Oncology

## 2017-06-28 ENCOUNTER — Encounter (HOSPITAL_COMMUNITY): Payer: Self-pay | Admitting: Oncology

## 2017-06-28 VITALS — BP 126/68 | HR 64 | Temp 97.9°F | Resp 18 | Wt 187.2 lb

## 2017-06-28 DIAGNOSIS — E559 Vitamin D deficiency, unspecified: Secondary | ICD-10-CM | POA: Insufficient documentation

## 2017-06-28 DIAGNOSIS — D500 Iron deficiency anemia secondary to blood loss (chronic): Secondary | ICD-10-CM

## 2017-06-28 DIAGNOSIS — Z23 Encounter for immunization: Secondary | ICD-10-CM | POA: Insufficient documentation

## 2017-06-28 DIAGNOSIS — K219 Gastro-esophageal reflux disease without esophagitis: Secondary | ICD-10-CM | POA: Insufficient documentation

## 2017-06-28 DIAGNOSIS — Z862 Personal history of diseases of the blood and blood-forming organs and certain disorders involving the immune mechanism: Secondary | ICD-10-CM

## 2017-06-28 DIAGNOSIS — K589 Irritable bowel syndrome without diarrhea: Secondary | ICD-10-CM | POA: Diagnosis not present

## 2017-06-28 DIAGNOSIS — Z9884 Bariatric surgery status: Secondary | ICD-10-CM | POA: Diagnosis not present

## 2017-06-28 LAB — COMPREHENSIVE METABOLIC PANEL
ALT: 14 U/L (ref 14–54)
AST: 20 U/L (ref 15–41)
Albumin: 4 g/dL (ref 3.5–5.0)
Alkaline Phosphatase: 46 U/L (ref 38–126)
Anion gap: 4 — ABNORMAL LOW (ref 5–15)
BUN: 15 mg/dL (ref 6–20)
CO2: 28 mmol/L (ref 22–32)
Calcium: 9.3 mg/dL (ref 8.9–10.3)
Chloride: 105 mmol/L (ref 101–111)
Creatinine, Ser: 0.74 mg/dL (ref 0.44–1.00)
GFR calc Af Amer: >60 mL/min (ref >60)
GFR calc non Af Amer: >60 mL/min (ref >60)
Glucose, Bld: 58 mg/dL — ABNORMAL LOW (ref 65–99)
Potassium: 4 mmol/L (ref 3.5–5.1)
Sodium: 137 mmol/L (ref 135–145)
Total Bilirubin: 0.6 mg/dL (ref 0.3–1.2)
Total Protein: 7.7 g/dL (ref 6.5–8.1)

## 2017-06-28 LAB — CBC WITH DIFFERENTIAL/PLATELET
Basophils Absolute: 0 10*3/uL (ref 0.0–0.1)
Basophils Relative: 1 %
Eosinophils Absolute: 0 10*3/uL (ref 0.0–0.7)
Eosinophils Relative: 1 %
HCT: 42.5 % (ref 36.0–46.0)
Hemoglobin: 13.3 g/dL (ref 12.0–15.0)
Lymphocytes Relative: 53 %
Lymphs Abs: 1.9 10*3/uL (ref 0.7–4.0)
MCH: 29.4 pg (ref 26.0–34.0)
MCHC: 31.3 g/dL (ref 30.0–36.0)
MCV: 93.8 fL (ref 78.0–100.0)
Monocytes Absolute: 0.2 10*3/uL (ref 0.1–1.0)
Monocytes Relative: 6 %
Neutro Abs: 1.4 10*3/uL — ABNORMAL LOW (ref 1.7–7.7)
Neutrophils Relative %: 39 %
Platelets: 174 10*3/uL (ref 150–400)
RBC: 4.53 MIL/uL (ref 3.87–5.11)
RDW: 13.4 % (ref 11.5–15.5)
WBC: 3.5 10*3/uL — ABNORMAL LOW (ref 4.0–10.5)

## 2017-06-28 LAB — IRON AND TIBC
Iron: 99 ug/dL (ref 28–170)
Saturation Ratios: 33 % — ABNORMAL HIGH (ref 10.4–31.8)
TIBC: 302 ug/dL (ref 250–450)
UIBC: 203 ug/dL

## 2017-06-28 LAB — VITAMIN B12: Vitamin B-12: 211 pg/mL (ref 180–914)

## 2017-06-28 LAB — FERRITIN: Ferritin: 160 ng/mL (ref 11–307)

## 2017-06-28 MED ORDER — INFLUENZA VAC SPLIT QUAD 0.5 ML IM SUSY
0.5000 mL | PREFILLED_SYRINGE | Freq: Once | INTRAMUSCULAR | Status: AC
  Administered 2017-06-28: 0.5 mL via INTRAMUSCULAR

## 2017-06-28 MED ORDER — INFLUENZA VAC SPLIT QUAD 0.5 ML IM SUSY
PREFILLED_SYRINGE | INTRAMUSCULAR | Status: AC
  Filled 2017-06-28: qty 0.5

## 2017-06-28 NOTE — Patient Instructions (Signed)
[facility] at [facility] Discharge Instructions  RECOMMENDATIONS MADE BY THE CONSULTANT AND ANY TEST RESULTS WILL BE SENT TO YOUR REFERRING PHYSICIAN.  You were seen today by Dr. Louise Zhou    Thank you for choosing [facility] at [facility] to provide your oncology and hematology care.  To afford each patient quality time with our provider, please arrive at least 15 minutes before your scheduled appointment time.    If you have a lab appointment with the Cancer Center please come in thru the  Main Entrance and check in at the main information desk  You need to re-schedule your appointment should you arrive 10 or more minutes late.  We strive to give you quality time with our providers, and arriving late affects you and other patients whose appointments are after yours.  Also, if you no show three or more times for appointments you may be dismissed from the clinic at the providers discretion.     Again, thank you for choosing [facility].  Our hope is that these requests will decrease the amount of time that you wait before being seen by our physicians.       _____________________________________________________________  Should you have questions after your visit to [facility], please contact our office at [phone] between the hours of 8:30 a.m. and 4:30 p.m.  Voicemails left after 4:30 p.m. will not be returned until the following business day.  For prescription refill requests, have your pharmacy contact our office.       Resources For Cancer Patients and their Caregivers ? American Cancer Society: Can assist with transportation, wigs, general needs, runs Look Good Feel Better.        1-888-227-6333 ? Cancer Care: Provides financial assistance, online support groups, medication/co-pay assistance.  1-800-813-HOPE (4673) ? [facility] Assists [place] cancer patients and their  families through emotional , educational and financial support.  336-427-4357 ? [place] DSS Where to apply for food stamps, Medicaid and utility assistance. 336-342-1394 ? RCATS: Transportation to medical appointments. 336-347-2287 ? Social Security Administration: May apply for disability if have a Stage IV cancer. 336-342-7796 1-800-772-1213 ? [place] Aging, Disability and Transit Services: Assists with nutrition, care and transit needs. 336-349-2343  Cancer Center Support Programs: @10RELATIVEDAYS@ > Cancer Support Group  2nd Tuesday of the month 1pm-2pm, Journey Room  > Creative Journey  3rd Tuesday of the month 1130am-1pm, Journey Room  > Look Good Feel Better  1st Wednesday of the month 10am-12 noon, Journey Room (Call American Cancer Society to register 1-800-395-5775)    

## 2017-06-28 NOTE — Progress Notes (Signed)
Candice BallsLinda Carol BoeingPeatross Ross presents today for injection per the provider's orders.  Fluarix administration without incident; see MAR for injection details.  Patient tolerated procedure well and without incident.  No questions or complaints noted at this time.  Discharged ambulatory.

## 2017-06-28 NOTE — Progress Notes (Signed)
[facility] Cancer Follow up:    Patient, No Pcp Per No address on file   DIAGNOSIS: Iron deficiency anemia  CURRENT THERAPY: Observation, IV iron PRN  INTERVAL HISTORY: Candice Ross 46 y.o. female with a history of gastric bypass returns for follow-up.  She states that overall she has been doing well, except fatigue.  She continues to work 2 jobs.  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  She also states that she has been having problems with constipation and only goes about twice a week because her stools are very hard.  She denies any headache, dizziness, chest pain, shortness of breath, abdominal pain, nausea, vomiting, diarrhea, focal weakness.   Patient Active Problem List   Diagnosis Date Noted  . Vitamin D insufficiency 05/04/2016  . Low serum vitamin B12 05/04/2016  . Irritable bowel syndrome with constipation 01/31/2014  . Status post gastric bypass for obesity 01/31/2014  . Iron deficiency anemia due to chronic blood loss 12/31/2013  . Abdominal pain, unspecified site 12/31/2013  . GERD (gastroesophageal reflux disease) 12/31/2013    is allergic to morphine and related.  MEDICAL HISTORY: Past Medical History:  Diagnosis Date  . Anemia   . Iron deficiency anemia due to chronic blood loss 12/31/2013  . Ulcerative colitis (HCC)    POSSIBLE    SURGICAL HISTORY: Past Surgical History:  Procedure Laterality Date  . COLONOSCOPY N/A 01/13/2014   Procedure: COLONOSCOPY;  Surgeon: West BaliSandi L Fields, MD;  [facility];  Service: Endoscopy;  Laterality: N/A;  10:30  . ESOPHAGOGASTRODUODENOSCOPY N/A 01/13/2014   Procedure: ESOPHAGOGASTRODUODENOSCOPY (EGD);  Surgeon: West BaliSandi L Fields, MD;  [facility];  Service: Endoscopy;  Laterality: N/A;  . LAPAROSCOPIC GASTRIC BYPASS     [place] 2006    SOCIAL HISTORY: Social History   Socioeconomic History  . Marital status: Divorced     Spouse name: Not on file  . Number of children: Not on file  . Years of education: Not on file  . Highest education level: Not on file  Social Needs  . Financial resource strain: Not on file  . Food insecurity - worry: Not on file  . Food insecurity - inability: Not on file  . Transportation needs - medical: Not on file  . Transportation needs - non-medical: Not on file  Occupational History  . Occupation: FirefighterCaterer  . Occupation: Water quality scientistUnit Director for [employer]  Tobacco Use  . Smoking status: Never Smoker  . Smokeless tobacco: Never Used  Substance and Sexual Activity  . Alcohol use: No  . Drug use: No  . Sexual activity: Yes    Birth control/protection: IUD  Other Topics Concern  . Not on file  Social History Narrative  . Not on file    FAMILY HISTORY: Family History  Problem Relation Age of Onset  . Colon cancer Neg Hx     Review of Systems - Oncology  ROS as per HPI otherwise 12 point ROS is negative.   PHYSICAL EXAMINATION  Vitals:   06/28/17 1148  BP: 126/68  Pulse: 64  Resp: 18  Temp: 97.9 F (36.6 C)  SpO2: 100%    Physical Exam Constitutional: Well-developed, well-nourished, and in no distress.   HENT:  Head: Normocephalic and atraumatic.  Mouth/Throat: No oropharyngeal exudate. Mucosa moist. Eyes: Pupils are equal, round, and reactive to light. Conjunctivae are normal. No scleral icterus.  Neck: Normal range of motion. Neck supple. No JVD present.  Cardiovascular: Normal rate, regular rhythm and normal heart sounds.  Exam reveals no gallop and no friction rub.   No murmur heard. Pulmonary/Chest: Effort normal and breath sounds normal. No respiratory distress. No wheezes.No rales.  Abdominal: Soft. Bowel sounds are normal. No distension. There is no tenderness. There is no guarding.  Musculoskeletal: No edema or tenderness.  Lymphadenopathy:    No cervical or supraclavicular adenopathy.  Neurological: Alert and oriented to person, place, and time.  No cranial nerve deficit.  Skin: Skin is warm and dry. No rash noted. No erythema. No pallor.  Psychiatric: Affect and judgment normal.   LABORATORY DATA:  CBC    Component Value Date/Time   WBC 3.5 (L) 06/28/2017 1137   RBC 4.53 06/28/2017 1137   HGB 13.3 06/28/2017 1137   HGB 8.6 (L) 03/07/2013 1546   HCT 42.5 06/28/2017 1137   HCT 27.9 (L) 03/07/2013 1546   PLT 174 06/28/2017 1137   PLT 299 03/07/2013 1546   MCV 93.8 06/28/2017 1137   MCV 65 (L) 03/07/2013 1546   MCH 29.4 06/28/2017 1137   MCHC 31.3 06/28/2017 1137   RDW 13.4 06/28/2017 1137   RDW 20.7 (H) 03/07/2013 1546   LYMPHSABS 1.9 06/28/2017 1137   MONOABS 0.2 06/28/2017 1137   EOSABS 0.0 06/28/2017 1137   BASOSABS 0.0 06/28/2017 1137    CMP     Component Value Date/Time   NA 137 06/28/2017 1137   NA 139 03/07/2013 1546   K 4.0 06/28/2017 1137   K 3.7 03/07/2013 1546   CL 105 06/28/2017 1137   CL 108 (H) 03/07/2013 1546   CO2 28 06/28/2017 1137   CO2 27 03/07/2013 1546   GLUCOSE 58 (L) 06/28/2017 1137   GLUCOSE 100 (H) 03/07/2013 1546   BUN 15 06/28/2017 1137   BUN 10 03/07/2013 1546   CREATININE 0.74 06/28/2017 1137   CREATININE 0.83 03/07/2013 1546   CALCIUM 9.3 06/28/2017 1137   CALCIUM 8.7 03/07/2013 1546   PROT 7.7 06/28/2017 1137   PROT 8.6 (H) 03/07/2013 1546   ALBUMIN 4.0 06/28/2017 1137   ALBUMIN 3.4 03/07/2013 1546   AST 20 06/28/2017 1137   AST 24 03/07/2013 1546   ALT 14 06/28/2017 1137   ALT 16 03/07/2013 1546   ALKPHOS 46 06/28/2017 1137   ALKPHOS 82 03/07/2013 1546   BILITOT 0.6 06/28/2017 1137   BILITOT 0.2 03/07/2013 1546   GFRNONAA >60 06/28/2017 1137   GFRNONAA >60 03/07/2013 1546   GFRAA >60 06/28/2017 1137   GFRAA >60 03/07/2013 1546       ASSESSMENT and THERAPY PLAN:  1. Iron deficiency anemia 2. History of gastric bypass  PLAN: Reviewed labs in detail with her today.  She is not anemic, hemoglobin is 13.3 g/dL today.  Iron studies are pending, will follow  up. Patient's hemoglobin has been stable for months now.  Plan to repeat labs at 3 months and 6 months. Return to clinic in 6 months for follow-up. Annual flu vaccine given today at patient's request.   Orders Placed This Encounter  Procedures  . CBC with Differential    Standing Status:   Future    Number of Occurrences:   1    Standing Expiration Date:   06/28/2018  . Comprehensive metabolic panel    Standing Status:   Future    Number of Occurrences:   1    Standing Expiration Date:   06/28/2018  .  Iron and TIBC    Standing Status:   Future    Number of Occurrences:   1    Standing Expiration Date:   06/28/2018  . Ferritin    Standing Status:   Future    Number of Occurrences:   1    Standing Expiration Date:   06/28/2018  . Vitamin B12    Standing Status:   Future    Number of Occurrences:   1    Standing Expiration Date:   06/28/2018  . CBC with Differential    Standing Status:   Standing    Number of Occurrences:   2    Standing Expiration Date:   06/28/2018  . Comprehensive metabolic panel    Standing Status:   Standing    Number of Occurrences:   2    Standing Expiration Date:   06/28/2018  . Iron and TIBC    Standing Status:   Standing    Number of Occurrences:   2    Standing Expiration Date:   06/28/2018  . Ferritin    Standing Status:   Standing    Number of Occurrences:   2    Standing Expiration Date:   06/28/2018    All questions were answered. The patient knows to call the clinic with any problems, questions or concerns. We can certainly see the patient much sooner if necessary. This note was electronically signed. Ralene CorkLouise Temeca Somma, MD 06/28/2017

## 2017-09-25 ENCOUNTER — Other Ambulatory Visit (HOSPITAL_COMMUNITY): Payer: Self-pay | Admitting: *Deleted

## 2017-09-25 DIAGNOSIS — D500 Iron deficiency anemia secondary to blood loss (chronic): Secondary | ICD-10-CM

## 2017-09-26 ENCOUNTER — Inpatient Hospital Stay (HOSPITAL_COMMUNITY): Payer: BLUE CROSS/BLUE SHIELD | Attending: Internal Medicine

## 2017-09-26 DIAGNOSIS — D509 Iron deficiency anemia, unspecified: Secondary | ICD-10-CM | POA: Diagnosis not present

## 2017-09-26 DIAGNOSIS — D500 Iron deficiency anemia secondary to blood loss (chronic): Secondary | ICD-10-CM

## 2017-09-26 LAB — COMPREHENSIVE METABOLIC PANEL
ALT: 14 U/L (ref 14–54)
AST: 19 U/L (ref 15–41)
Albumin: 3.7 g/dL (ref 3.5–5.0)
Alkaline Phosphatase: 41 U/L (ref 38–126)
Anion gap: 7 (ref 5–15)
BUN: 14 mg/dL (ref 6–20)
CO2: 24 mmol/L (ref 22–32)
Calcium: 8.8 mg/dL — ABNORMAL LOW (ref 8.9–10.3)
Chloride: 104 mmol/L (ref 101–111)
Creatinine, Ser: 0.72 mg/dL (ref 0.44–1.00)
GFR calc Af Amer: >60 mL/min (ref >60)
GFR calc non Af Amer: >60 mL/min (ref >60)
Glucose, Bld: 75 mg/dL (ref 65–99)
Potassium: 4 mmol/L (ref 3.5–5.1)
Sodium: 135 mmol/L (ref 135–145)
Total Bilirubin: 0.5 mg/dL (ref 0.3–1.2)
Total Protein: 7.2 g/dL (ref 6.5–8.1)

## 2017-09-26 LAB — CBC WITH DIFFERENTIAL/PLATELET
Basophils Absolute: 0 10*3/uL (ref 0.0–0.1)
Basophils Relative: 0 %
Eosinophils Absolute: 0 10*3/uL (ref 0.0–0.7)
Eosinophils Relative: 1 %
HCT: 40.2 % (ref 36.0–46.0)
Hemoglobin: 12.8 g/dL (ref 12.0–15.0)
Lymphocytes Relative: 46 %
Lymphs Abs: 1.5 10*3/uL (ref 0.7–4.0)
MCH: 30.2 pg (ref 26.0–34.0)
MCHC: 31.8 g/dL (ref 30.0–36.0)
MCV: 94.8 fL (ref 78.0–100.0)
Monocytes Absolute: 0.4 10*3/uL (ref 0.1–1.0)
Monocytes Relative: 13 %
Neutro Abs: 1.3 10*3/uL — ABNORMAL LOW (ref 1.7–7.7)
Neutrophils Relative %: 40 %
Platelets: 168 10*3/uL (ref 150–400)
RBC: 4.24 MIL/uL (ref 3.87–5.11)
RDW: 14 % (ref 11.5–15.5)
WBC: 3.3 10*3/uL — ABNORMAL LOW (ref 4.0–10.5)

## 2017-09-26 LAB — IRON AND TIBC
Iron: 104 ug/dL (ref 28–170)
Saturation Ratios: 36 % — ABNORMAL HIGH (ref 10.4–31.8)
TIBC: 287 ug/dL (ref 250–450)
UIBC: 183 ug/dL

## 2017-09-26 LAB — FERRITIN: Ferritin: 106 ng/mL (ref 11–307)

## 2017-12-18 ENCOUNTER — Other Ambulatory Visit (HOSPITAL_COMMUNITY): Payer: Self-pay | Admitting: *Deleted

## 2017-12-18 DIAGNOSIS — D500 Iron deficiency anemia secondary to blood loss (chronic): Secondary | ICD-10-CM

## 2017-12-20 ENCOUNTER — Other Ambulatory Visit (HOSPITAL_COMMUNITY): Payer: BLUE CROSS/BLUE SHIELD

## 2017-12-21 ENCOUNTER — Other Ambulatory Visit (HOSPITAL_COMMUNITY): Payer: BLUE CROSS/BLUE SHIELD

## 2017-12-22 ENCOUNTER — Inpatient Hospital Stay (HOSPITAL_COMMUNITY): Payer: BLUE CROSS/BLUE SHIELD | Attending: Hematology

## 2017-12-22 DIAGNOSIS — D509 Iron deficiency anemia, unspecified: Secondary | ICD-10-CM | POA: Insufficient documentation

## 2017-12-22 DIAGNOSIS — D500 Iron deficiency anemia secondary to blood loss (chronic): Secondary | ICD-10-CM

## 2017-12-22 LAB — CBC WITH DIFFERENTIAL/PLATELET
Basophils Absolute: 0 10*3/uL (ref 0.0–0.1)
Basophils Relative: 0 %
Eosinophils Absolute: 0 10*3/uL (ref 0.0–0.7)
Eosinophils Relative: 0 %
HCT: 39.1 % (ref 36.0–46.0)
Hemoglobin: 12.7 g/dL (ref 12.0–15.0)
Lymphocytes Relative: 34 %
Lymphs Abs: 1.7 10*3/uL (ref 0.7–4.0)
MCH: 30.2 pg (ref 26.0–34.0)
MCHC: 32.5 g/dL (ref 30.0–36.0)
MCV: 92.9 fL (ref 78.0–100.0)
Monocytes Absolute: 0.4 10*3/uL (ref 0.1–1.0)
Monocytes Relative: 9 %
Neutro Abs: 3 10*3/uL (ref 1.7–7.7)
Neutrophils Relative %: 57 %
Platelets: 170 10*3/uL (ref 150–400)
RBC: 4.21 MIL/uL (ref 3.87–5.11)
RDW: 13.5 % (ref 11.5–15.5)
WBC: 5.2 10*3/uL (ref 4.0–10.5)

## 2017-12-22 LAB — FERRITIN: Ferritin: 98 ng/mL (ref 11–307)

## 2017-12-22 LAB — IRON AND TIBC
Iron: 74 ug/dL (ref 28–170)
Saturation Ratios: 24 % (ref 10.4–31.8)
TIBC: 305 ug/dL (ref 250–450)
UIBC: 231 ug/dL

## 2017-12-22 LAB — COMPREHENSIVE METABOLIC PANEL
ALT: 16 U/L (ref 14–54)
AST: 23 U/L (ref 15–41)
Albumin: 3.8 g/dL (ref 3.5–5.0)
Alkaline Phosphatase: 37 U/L — ABNORMAL LOW (ref 38–126)
Anion gap: 7 (ref 5–15)
BUN: 13 mg/dL (ref 6–20)
CO2: 22 mmol/L (ref 22–32)
Calcium: 8.9 mg/dL (ref 8.9–10.3)
Chloride: 105 mmol/L (ref 101–111)
Creatinine, Ser: 0.75 mg/dL (ref 0.44–1.00)
GFR calc Af Amer: >60 mL/min (ref >60)
GFR calc non Af Amer: >60 mL/min (ref >60)
Glucose, Bld: 114 mg/dL — ABNORMAL HIGH (ref 65–99)
Potassium: 3.5 mmol/L (ref 3.5–5.1)
Sodium: 134 mmol/L — ABNORMAL LOW (ref 135–145)
Total Bilirubin: 0.8 mg/dL (ref 0.3–1.2)
Total Protein: 7.5 g/dL (ref 6.5–8.1)

## 2017-12-22 LAB — VITAMIN B12: Vitamin B-12: 233 pg/mL (ref 180–914)

## 2017-12-27 ENCOUNTER — Encounter (HOSPITAL_COMMUNITY): Payer: Self-pay | Admitting: Internal Medicine

## 2017-12-27 ENCOUNTER — Inpatient Hospital Stay (HOSPITAL_COMMUNITY): Payer: BLUE CROSS/BLUE SHIELD | Attending: Internal Medicine | Admitting: Internal Medicine

## 2017-12-27 VITALS — BP 123/64 | HR 71 | Temp 98.3°F | Resp 18 | Wt 200.5 lb

## 2017-12-27 DIAGNOSIS — K589 Irritable bowel syndrome without diarrhea: Secondary | ICD-10-CM | POA: Diagnosis not present

## 2017-12-27 DIAGNOSIS — D500 Iron deficiency anemia secondary to blood loss (chronic): Secondary | ICD-10-CM

## 2017-12-27 DIAGNOSIS — D509 Iron deficiency anemia, unspecified: Secondary | ICD-10-CM | POA: Insufficient documentation

## 2017-12-27 NOTE — Progress Notes (Signed)
Diagnosis Iron deficiency anemia due to chronic blood loss - Plan: CBC with Differential/Platelet, Comprehensive metabolic panel, Lactate dehydrogenase, Ferritin, CBC with Differential/Platelet, Comprehensive metabolic panel, Lactate dehydrogenase, Ferritin  Staging Cancer Staging No matching staging information was found for the patient.  Assessment and Plan: 1. Iron deficiency anemia.  Pt has undergone gastric bypass in the past.  She was last treated with IV iron in 04/2016.  Labs done 12/22/2017 show WBC 5.2 HB 12.7 plts 170,000.  Ferritin 98. Chemistries WNL with Cr 0.75.  HB stable dating back to 2017.  Pt will have repeat labs in 6 months and will be seen for follow-up in 1 year.    2.  IBS.  Follow-up with GI as recommended.    3.  Health maintenance.  Mammograms as recommended.    4. Vaginal bleeding.  Pt reports this occurs occasionally and she relates it to IUD.  She should follow-up with GYN as recommended.  HB on labs done 12/22/2017 was 12.7.  Will repeat labs in 6 months.   Current Status:  Pt is seen today for follow-up and she is here to go over labs.  When questioned, she reports occasional vaginal bleeding.    Problem List Patient Active Problem List   Diagnosis Date Noted  . Vitamin D insufficiency [E55.9] 05/04/2016  . Low serum vitamin B12 [E53.8] 05/04/2016  . Irritable bowel syndrome with constipation [K58.1] 01/31/2014  . Status post gastric bypass for obesity [Z98.84] 01/31/2014  . Iron deficiency anemia due to chronic blood loss [D50.0] 12/31/2013  . Abdominal pain, unspecified site [R10.9] 12/31/2013  . GERD (gastroesophageal reflux disease) [K21.9] 12/31/2013    Past Medical History Past Medical History:  Diagnosis Date  . Anemia   . Iron deficiency anemia due to chronic blood loss 12/31/2013  . Ulcerative colitis ([place])    POSSIBLE    Past Surgical History Past Surgical History:  Procedure Laterality Date  . COLONOSCOPY N/A 01/13/2014   Procedure:  COLONOSCOPY;  Surgeon: Danie Binder, MD;  [facility];  Service: Endoscopy;  Laterality: N/A;  10:30  . ESOPHAGOGASTRODUODENOSCOPY N/A 01/13/2014   Procedure: ESOPHAGOGASTRODUODENOSCOPY (EGD);  Surgeon: Danie Binder, MD;  [facility];  Service: Endoscopy;  Laterality: N/A;  . LAPAROSCOPIC GASTRIC BYPASS     [place]    Family History Family History  Problem Relation Age of Onset  . Colon cancer Neg Hx      Social History  reports that she has never smoked. She has never used smokeless tobacco. She reports that she does not drink alcohol or use drugs.  Medications No current outpatient medications on file. No current facility-administered medications for this visit.   Facility-Administered Medications Ordered in Other Visits:  .  0.9 %  sodium chloride infusion, , Intravenous, Continuous, Kefalas, Manon Hilding, PA-C, Last Rate: 10 mL/hr at 11/04/15 1253  Allergies Morphine and related  Review of Systems Review of Systems - Oncology ROS as per HPI otherwise 12 point ROS is negative.   Physical Exam  Vitals Wt Readings from Last 3 Encounters:  12/27/17 200 lb 8 oz (90.9 kg)  06/28/17 187 lb 3.2 oz (84.9 kg)  05/03/16 208 lb 9.6 oz (94.6 kg)   Temp Readings from Last 3 Encounters:  12/27/17 98.3 F (36.8 C) (Oral)  06/28/17 97.9 F (36.6 C) (Oral)  05/11/16 97.3 F (36.3 C) (Oral)   BP Readings from Last 3 Encounters:  12/27/17 123/64  06/28/17 126/68  05/04/17 135/88  Pulse Readings from Last 3 Encounters:  12/27/17 71  06/28/17 64  05/04/17 (!) 55   Constitutional: Well-developed, well-nourished, and in no distress.   HENT: Head: Normocephalic and atraumatic.  Mouth/Throat: No oropharyngeal exudate. Mucosa moist. Eyes: Pupils are equal, round, and reactive to light. Conjunctivae are normal. No scleral icterus.  Neck: Normal range of motion. Neck supple. No JVD present.  Cardiovascular: Normal rate, regular rhythm and normal  heart sounds.  Exam reveals no gallop and no friction rub.   No murmur heard. Pulmonary/Chest: Effort normal and breath sounds normal. No respiratory distress. No wheezes.No rales.  Abdominal: Soft. Bowel sounds are normal. No distension. There is no tenderness. There is no guarding.  Musculoskeletal: No edema or tenderness.  Lymphadenopathy: No cervica, axillary or supraclavicular adenopathy.  Neurological: Alert and oriented to person, place, and time. No cranial nerve deficit.  Skin: Skin is warm and dry. No rash noted. No erythema. No pallor.  Psychiatric: Affect and judgment normal.   Labs No visits with results within 3 Day(s) from this visit.  Latest known visit with results is:  Appointment on 12/22/2017  Component Date Value Ref Range Status  . WBC 12/22/2017 5.2  4.0 - 10.5 K/uL Final  . RBC 12/22/2017 4.21  3.87 - 5.11 MIL/uL Final  . Hemoglobin 12/22/2017 12.7  12.0 - 15.0 g/dL Final  . HCT 12/22/2017 39.1  36.0 - 46.0 % Final  . MCV 12/22/2017 92.9  78.0 - 100.0 fL Final  . MCH 12/22/2017 30.2  26.0 - 34.0 pg Final  . MCHC 12/22/2017 32.5  30.0 - 36.0 g/dL Final  . RDW 12/22/2017 13.5  11.5 - 15.5 % Final  . Platelets 12/22/2017 170  150 - 400 K/uL Final  . Neutrophils Relative % 12/22/2017 57  % Final  . Neutro Abs 12/22/2017 3.0  1.7 - 7.7 K/uL Final  . Lymphocytes Relative 12/22/2017 34  % Final  . Lymphs Abs 12/22/2017 1.7  0.7 - 4.0 K/uL Final  . Monocytes Relative 12/22/2017 9  % Final  . Monocytes Absolute 12/22/2017 0.4  0.1 - 1.0 K/uL Final  . Eosinophils Relative 12/22/2017 0  % Final  . Eosinophils Absolute 12/22/2017 0.0  0.0 - 0.7 K/uL Final  . Basophils Relative 12/22/2017 0  % Final  . Basophils Absolute 12/22/2017 0.0  0.0 - 0.1 K/uL Final   Performed at [facility], [address], [place], [place] [address]  . Sodium 12/22/2017 134* 135 - 145 mmol/L Final  . Potassium 12/22/2017 3.5  3.5 - 5.1 mmol/L Final  . Chloride 12/22/2017 105  101 - 111  mmol/L Final  . CO2 12/22/2017 22  22 - 32 mmol/L Final  . Glucose, Bld 12/22/2017 114* 65 - 99 mg/dL Final  . BUN 12/22/2017 13  6 - 20 mg/dL Final  . Creatinine, Ser 12/22/2017 0.75  0.44 - 1.00 mg/dL Final  . Calcium 12/22/2017 8.9  8.9 - 10.3 mg/dL Final  . Total Protein 12/22/2017 7.5  6.5 - 8.1 g/dL Final  . Albumin 12/22/2017 3.8  3.5 - 5.0 g/dL Final  . AST 12/22/2017 23  15 - 41 U/L Final  . ALT 12/22/2017 16  14 - 54 U/L Final  . Alkaline Phosphatase 12/22/2017 37* 38 - 126 U/L Final  . Total Bilirubin 12/22/2017 0.8  0.3 - 1.2 mg/dL Final  . GFR calc non Af Amer 12/22/2017 >60  >60 mL/min Final  . GFR calc Af Amer 12/22/2017 >60  >60 mL/min Final  Comment: (NOTE) The eGFR has been calculated using the CKD EPI equation. This calculation has not been validated in all clinical situations. eGFR's persistently <60 mL/min signify possible Chronic Kidney Disease.   Georgiann Hahn gap 12/22/2017 7  5 - 15 Final   Performed at [facility], [address], [place], [place] [address]  . Iron 12/22/2017 74  28 - 170 ug/dL Final  . TIBC 12/22/2017 305  250 - 450 ug/dL Final  . Saturation Ratios 12/22/2017 24  10.4 - 31.8 % Final  . UIBC 12/22/2017 231  ug/dL Final   Performed at [facility], [place] [address] [place], [place] [address]  . Ferritin 12/22/2017 98  11 - 307 ng/mL Final   Performed at [facility] [address], [place], [place] [address]  . Vitamin B-12 12/22/2017 233  180 - 914 pg/mL Final   Comment: (NOTE) This assay is not validated for testing neonatal or myeloproliferative syndrome specimens for Vitamin B12 levels. Performed at [facility], [place] [address] [place],  [address]      Pathology Orders Placed This Encounter  Procedures  . CBC with Differential/Platelet    Standing Status:   Future    Standing Expiration Date:   12/28/2018  . Comprehensive metabolic panel    Standing Status:   Future    Standing Expiration Date:   12/28/2018   . Lactate dehydrogenase    Standing Status:   Future    Standing Expiration Date:   12/28/2018  . Ferritin    Standing Status:   Future    Standing Expiration Date:   12/28/2018  . CBC with Differential/Platelet    Standing Status:   Future    Standing Expiration Date:   12/28/2019  . Comprehensive metabolic panel    Standing Status:   Future    Standing Expiration Date:   12/28/2019  . Lactate dehydrogenase    Standing Status:   Future    Standing Expiration Date:   12/28/2019  . Ferritin    Standing Status:   Future    Standing Expiration Date:   12/28/2019       Zoila Shutter MD

## 2017-12-27 NOTE — Patient Instructions (Signed)
[facility] at [facility]  Discharge Instructions:  You were seen by dr. higgs today.  _______________________________________________________________  Thank you for choosing [facility] at [facility] to provide your oncology and hematology care.  To afford each patient quality time with our providers, please arrive at least 15 minutes before your scheduled appointment.  You need to re-schedule your appointment if you arrive 10 or more minutes late.  We strive to give you quality time with our providers, and arriving late affects you and other patients whose appointments are after yours.  Also, if you no show three or more times for appointments you may be dismissed from the clinic.  Again, thank you for choosing [facility] at [facility]. Our hope is that these requests will allow you access to exceptional care and in a timely manner. _______________________________________________________________  If you have questions after your visit, please contact our office at [phone] between the hours of 8:30 a.m. and 5:00 p.m. Voicemails left after 4:30 p.m. will not be returned until the following business day. _______________________________________________________________  For prescription refill requests, have your pharmacy contact our office. _______________________________________________________________  Recommendations made by the consultant and any test results will be sent to your referring physician. _______________________________________________________________ 

## 2018-01-23 DIAGNOSIS — R87810 Cervical high risk human papillomavirus (HPV) DNA test positive: Secondary | ICD-10-CM | POA: Diagnosis not present

## 2018-01-23 DIAGNOSIS — Z6831 Body mass index (BMI) 31.0-31.9, adult: Secondary | ICD-10-CM | POA: Diagnosis not present

## 2018-01-23 DIAGNOSIS — Z1231 Encounter for screening mammogram for malignant neoplasm of breast: Secondary | ICD-10-CM | POA: Diagnosis not present

## 2018-01-23 DIAGNOSIS — Z01419 Encounter for gynecological examination (general) (routine) without abnormal findings: Secondary | ICD-10-CM | POA: Diagnosis not present

## 2018-01-23 DIAGNOSIS — Z1151 Encounter for screening for human papillomavirus (HPV): Secondary | ICD-10-CM | POA: Diagnosis not present

## 2018-02-15 DIAGNOSIS — R87810 Cervical high risk human papillomavirus (HPV) DNA test positive: Secondary | ICD-10-CM | POA: Diagnosis not present

## 2018-02-15 DIAGNOSIS — N870 Mild cervical dysplasia: Secondary | ICD-10-CM | POA: Diagnosis not present

## 2018-02-15 DIAGNOSIS — R87610 Atypical squamous cells of undetermined significance on cytologic smear of cervix (ASC-US): Secondary | ICD-10-CM | POA: Diagnosis not present

## 2018-02-22 ENCOUNTER — Other Ambulatory Visit: Payer: Self-pay | Admitting: Obstetrics & Gynecology

## 2018-02-22 DIAGNOSIS — Z01818 Encounter for other preprocedural examination: Secondary | ICD-10-CM

## 2018-02-23 ENCOUNTER — Other Ambulatory Visit: Payer: Self-pay | Admitting: Obstetrics & Gynecology

## 2018-02-23 DIAGNOSIS — Z01818 Encounter for other preprocedural examination: Secondary | ICD-10-CM

## 2018-02-28 ENCOUNTER — Other Ambulatory Visit: Payer: Self-pay | Admitting: Plastic Surgery

## 2018-02-28 ENCOUNTER — Ambulatory Visit
Admission: RE | Admit: 2018-02-28 | Discharge: 2018-02-28 | Disposition: A | Discharge status: Home / Self Care | Payer: BLUE CROSS/BLUE SHIELD | Source: Ambulatory Visit | Attending: Plastic Surgery | Admitting: Plastic Surgery

## 2018-02-28 ENCOUNTER — Ambulatory Visit
Admission: RE | Admit: 2018-02-28 | Discharge: 2018-02-28 | Disposition: A | Discharge status: Home / Self Care | Payer: BLUE CROSS/BLUE SHIELD | Source: Ambulatory Visit | Attending: Obstetrics & Gynecology | Admitting: Obstetrics & Gynecology

## 2018-02-28 DIAGNOSIS — K429 Umbilical hernia without obstruction or gangrene: Secondary | ICD-10-CM | POA: Diagnosis not present

## 2018-02-28 DIAGNOSIS — Z01811 Encounter for preprocedural respiratory examination: Secondary | ICD-10-CM

## 2018-02-28 DIAGNOSIS — Z01818 Encounter for other preprocedural examination: Secondary | ICD-10-CM

## 2018-03-06 ENCOUNTER — Ambulatory Visit: Payer: BLUE CROSS/BLUE SHIELD | Admitting: Cardiology

## 2018-03-06 ENCOUNTER — Encounter: Payer: Self-pay | Admitting: Cardiology

## 2018-03-06 VITALS — BP 118/74 | HR 59 | Ht 66.0 in | Wt 178.2 lb

## 2018-03-06 DIAGNOSIS — Z01810 Encounter for preprocedural cardiovascular examination: Secondary | ICD-10-CM | POA: Diagnosis not present

## 2018-03-06 NOTE — Patient Instructions (Signed)
Your physician recommends that you schedule a follow-up appointment with Dr. Christopher as needed  

## 2018-03-06 NOTE — Progress Notes (Signed)
Cardiology Office Note:    Date:  03/06/2018   ID:  Candice Ross, DOB [birth year], MRN [id number]  PCP:  Patient, No Pcp Per  Cardiologist:  Jodelle RedBridgette Shanee Batch, MD PhD  Referring MD: Shea EvansMody, Vaishali, MD   CC: Preoperative cardiac evaluation  History of Present Illness:    Candice Ross is a 47 y.o. female with a hx of gastric bypass surgery who is seen as a new consult at the request of Dr. Juliene PinaMody for cardiac evaluation prior to skin removal surgery. [place].  Patient reports no history of cardiac disease. She woorks out nearly every day; at a minimum she does a mile walk per day. Walks, does cardio, lifts weights, takes fitness classes. Works out at least 1 hour per day. No chest pain or shortness of breath. Also works 60 hours/week without issues.   No past issues with anesthesia or operation. Went from 400 lbs to 175 lbs after gastric bypass. Had some GI/anemia issues postoperatively, but those have resolved.   FH: no history of heart disease. Mother had a stroke and passed at age 48. Grandmother died of cancer in her mid 380s.  SH: never smoker. EtOH: none.  Past Medical History:  Diagnosis Date  . Anemia   . Iron deficiency anemia due to chronic blood loss 12/31/2013  . Ulcerative colitis (HCC)    POSSIBLE    Past Surgical History:  Procedure Laterality Date  . COLONOSCOPY N/A 01/13/2014   Procedure: COLONOSCOPY;  Surgeon: West BaliSandi L Fields, MD;  [facility];  Service: Endoscopy;  Laterality: N/A;  10:30  . ESOPHAGOGASTRODUODENOSCOPY N/A 01/13/2014   Procedure: ESOPHAGOGASTRODUODENOSCOPY (EGD);  Surgeon: West BaliSandi L Fields, MD;  [facility];  Service: Endoscopy;  Laterality: N/A;  . LAPAROSCOPIC GASTRIC BYPASS     [place] 2006    Current Medications: No current outpatient medications on file prior to visit.   Current Facility-Administered Medications on File Prior to Visit    Medication  . 0.9 %  sodium chloride infusion     Allergies:   Morphine and related   Social History   Socioeconomic History  . Marital status: Divorced    Spouse name: Not on file  . Number of children: Not on file  . Years of education: Not on file  . Highest education level: Not on file  Occupational History  . Occupation: FirefighterCaterer  . Occupation: Water quality scientistUnit Director for [employer]  Social Needs  . Financial resource strain: Not on file  . Food insecurity:    Worry: Not on file    Inability: Not on file  . Transportation needs:    Medical: Not on file    Non-medical: Not on file  Tobacco Use  . Smoking status: Never Smoker  . Smokeless tobacco: Never Used  Substance and Sexual Activity  . Alcohol use: No  . Drug use: No  . Sexual activity: Yes    Birth control/protection: IUD  Lifestyle  . Physical activity:    Days per week: Not on file    Minutes per session: Not on file  . Stress: Not on file  Relationships  . Social connections:    Talks on phone: Not on file    Gets together: Not on file    Attends religious service: Not on file    Active member of club or organization: Not on file    Attends meetings of clubs or organizations: Not on file  Relationship status: Not on file  Other Topics Concern  . Not on file  Social History Narrative  . Not on file     Family History: The patient's family history is negative for Colon cancer. Denies any FH of heart disease. Mother passed away from a stroke in her 3760s.   ROS:   Please see the history of present illness.  Additional pertinent ROS: Review of Systems  Constitutional: Negative for chills, fever and malaise/fatigue.  HENT: Negative for ear pain and hearing loss.   Eyes: Negative for blurred vision and pain.  Respiratory: Negative for cough and shortness of breath.   Cardiovascular: Negative for chest pain, palpitations, orthopnea, claudication, leg swelling and PND.  Gastrointestinal: Negative for  abdominal pain, blood in stool and melena.  Genitourinary: Negative for dysuria and hematuria.  Musculoskeletal: Negative for falls, joint pain and myalgias.  Skin: Negative for rash.  Neurological: Negative for focal weakness and loss of consciousness.  Endo/Heme/Allergies: Does not bruise/bleed easily.   EKGs/Labs/Other Studies Reviewed:    The following studies were reviewed today: Records from Dr. Camillia HerterMody's office  EKG:  EKG is ordered today.  The ekg ordered today demonstrates sinus bradycardia.  Recent Labs: 12/22/2017: ALT 16; BUN 13; Creatinine, Ser 0.75; Hemoglobin 12.7; Platelets 170; Potassium 3.5; Sodium 134  Recent Lipid Panel No results found for: CHOL, TRIG, HDL, CHOLHDL, VLDL, LDLCALC, LDLDIRECT  Physical Exam:    VS:  BP 118/74   Pulse (!) 59   Ht 5\' 6"  (1.676 m)   Wt 178 lb 3.2 oz (80.8 kg)   BMI 28.76 kg/m     Wt Readings from Last 3 Encounters:  03/06/18 178 lb 3.2 oz (80.8 kg)  12/27/17 200 lb 8 oz (90.9 kg)  06/28/17 187 lb 3.2 oz (84.9 kg)     GEN: Well nourished, well developed in no acute distress HEENT: Normal NECK: No JVD; No carotid bruits LYMPHATICS: No lymphadenopathy CARDIAC: regular rhythm, normal S1 and S2, no murmurs, rubs, gallops. Radial and DP pulses 2+ bilaterally. RESPIRATORY:  Clear to auscultation without rales, wheezing or rhonchi  ABDOMEN: Soft, non-tender, non-distended MUSCULOSKELETAL:  No edema; No deformity  SKIN: Warm and dry NEUROLOGIC:  Alert and oriented x 3 PSYCHIATRIC:  Normal affect   ASSESSMENT:    1. Preoperative cardiovascular examination    PLAN:    1. Preoperative cardiac evaluation: patient has no cardiac history and no active cardiac symptoms. Her RCRI score is 0, which is lowest risk for perioperative cardiovascular events. No further testing recommended prior to surgery. She is on no active medications beyond a multivitamin, and no medications need to be added prior to surgery. She is optimized from a  cardiovascular standpoint.  Plan for follow up: As needed.  Medication Adjustments/Labs and Tests Ordered: Current medicines are reviewed at length with the patient today.  Concerns regarding medicines are outlined above.  Orders Placed This Encounter  Procedures  . EKG 12-Lead   No orders of the defined types were placed in this encounter.   Patient Instructions  Your physician recommends that you schedule a follow-up appointment with Dr. Cristal Deerhristopher as needed.      Signed, Jodelle RedBridgette Alizza Sacra, MD PhD 03/06/2018 3:14 PM    [facility]

## 2018-03-12 DIAGNOSIS — Z0000 Encounter for general adult medical examination without abnormal findings: Secondary | ICD-10-CM | POA: Diagnosis not present

## 2018-03-12 DIAGNOSIS — Z13220 Encounter for screening for lipoid disorders: Secondary | ICD-10-CM | POA: Diagnosis not present

## 2018-03-12 DIAGNOSIS — Z131 Encounter for screening for diabetes mellitus: Secondary | ICD-10-CM | POA: Diagnosis not present

## 2018-03-12 DIAGNOSIS — Z1329 Encounter for screening for other suspected endocrine disorder: Secondary | ICD-10-CM | POA: Diagnosis not present

## 2018-03-12 DIAGNOSIS — Z130 Encounter for screening for diseases of the blood and blood-forming organs and certain disorders involving the immune mechanism: Secondary | ICD-10-CM | POA: Diagnosis not present

## 2018-04-12 DIAGNOSIS — E872 Acidosis: Secondary | ICD-10-CM | POA: Diagnosis not present

## 2018-04-12 DIAGNOSIS — D649 Anemia, unspecified: Secondary | ICD-10-CM | POA: Diagnosis not present

## 2018-04-12 DIAGNOSIS — Z9882 Breast implant status: Secondary | ICD-10-CM | POA: Diagnosis not present

## 2018-04-12 DIAGNOSIS — E860 Dehydration: Secondary | ICD-10-CM | POA: Diagnosis not present

## 2018-04-12 DIAGNOSIS — D689 Coagulation defect, unspecified: Secondary | ICD-10-CM | POA: Diagnosis not present

## 2018-04-12 DIAGNOSIS — R55 Syncope and collapse: Secondary | ICD-10-CM | POA: Diagnosis not present

## 2018-04-12 DIAGNOSIS — Z2821 Immunization not carried out because of patient refusal: Secondary | ICD-10-CM | POA: Diagnosis not present

## 2018-04-12 DIAGNOSIS — D62 Acute posthemorrhagic anemia: Secondary | ICD-10-CM | POA: Diagnosis not present

## 2018-04-12 DIAGNOSIS — R42 Dizziness and giddiness: Secondary | ICD-10-CM | POA: Diagnosis not present

## 2018-04-12 DIAGNOSIS — E538 Deficiency of other specified B group vitamins: Secondary | ICD-10-CM | POA: Diagnosis not present

## 2018-04-12 DIAGNOSIS — D696 Thrombocytopenia, unspecified: Secondary | ICD-10-CM | POA: Diagnosis not present

## 2018-04-12 DIAGNOSIS — G8918 Other acute postprocedural pain: Secondary | ICD-10-CM | POA: Diagnosis not present

## 2018-04-12 DIAGNOSIS — R9431 Abnormal electrocardiogram [ECG] [EKG]: Secondary | ICD-10-CM | POA: Diagnosis not present

## 2018-04-12 DIAGNOSIS — Z9884 Bariatric surgery status: Secondary | ICD-10-CM | POA: Diagnosis not present

## 2018-04-12 DIAGNOSIS — M96841 Postprocedural hematoma of a musculoskeletal structure following other procedure: Secondary | ICD-10-CM | POA: Diagnosis not present

## 2018-04-12 DIAGNOSIS — L7631 Postprocedural hematoma of skin and subcutaneous tissue following a dermatologic procedure: Secondary | ICD-10-CM | POA: Diagnosis not present

## 2018-04-13 DIAGNOSIS — L7631 Postprocedural hematoma of skin and subcutaneous tissue following a dermatologic procedure: Secondary | ICD-10-CM | POA: Diagnosis not present

## 2018-04-13 DIAGNOSIS — R55 Syncope and collapse: Secondary | ICD-10-CM | POA: Diagnosis not present

## 2018-04-13 DIAGNOSIS — M96841 Postprocedural hematoma of a musculoskeletal structure following other procedure: Secondary | ICD-10-CM | POA: Diagnosis not present

## 2018-04-13 DIAGNOSIS — G8918 Other acute postprocedural pain: Secondary | ICD-10-CM | POA: Diagnosis not present

## 2018-04-13 DIAGNOSIS — R9431 Abnormal electrocardiogram [ECG] [EKG]: Secondary | ICD-10-CM | POA: Diagnosis not present

## 2018-04-13 DIAGNOSIS — E860 Dehydration: Secondary | ICD-10-CM | POA: Diagnosis not present

## 2018-04-13 DIAGNOSIS — E872 Acidosis: Secondary | ICD-10-CM | POA: Diagnosis not present

## 2018-04-13 DIAGNOSIS — R42 Dizziness and giddiness: Secondary | ICD-10-CM | POA: Diagnosis not present

## 2018-04-13 DIAGNOSIS — D62 Acute posthemorrhagic anemia: Secondary | ICD-10-CM | POA: Diagnosis not present

## 2018-04-14 DIAGNOSIS — R55 Syncope and collapse: Secondary | ICD-10-CM | POA: Diagnosis not present

## 2018-04-14 DIAGNOSIS — E860 Dehydration: Secondary | ICD-10-CM | POA: Diagnosis not present

## 2018-04-14 DIAGNOSIS — G8918 Other acute postprocedural pain: Secondary | ICD-10-CM | POA: Diagnosis not present

## 2018-04-14 DIAGNOSIS — E872 Acidosis: Secondary | ICD-10-CM | POA: Diagnosis not present

## 2018-04-14 DIAGNOSIS — L7631 Postprocedural hematoma of skin and subcutaneous tissue following a dermatologic procedure: Secondary | ICD-10-CM | POA: Diagnosis not present

## 2018-04-14 DIAGNOSIS — R42 Dizziness and giddiness: Secondary | ICD-10-CM | POA: Diagnosis not present

## 2018-04-14 DIAGNOSIS — D62 Acute posthemorrhagic anemia: Secondary | ICD-10-CM | POA: Diagnosis not present

## 2018-04-15 DIAGNOSIS — D62 Acute posthemorrhagic anemia: Secondary | ICD-10-CM | POA: Diagnosis not present

## 2018-04-15 DIAGNOSIS — E860 Dehydration: Secondary | ICD-10-CM | POA: Diagnosis not present

## 2018-04-15 DIAGNOSIS — G8918 Other acute postprocedural pain: Secondary | ICD-10-CM | POA: Diagnosis not present

## 2018-04-15 DIAGNOSIS — D689 Coagulation defect, unspecified: Secondary | ICD-10-CM | POA: Diagnosis not present

## 2018-04-15 DIAGNOSIS — R42 Dizziness and giddiness: Secondary | ICD-10-CM | POA: Diagnosis not present

## 2018-04-15 DIAGNOSIS — R55 Syncope and collapse: Secondary | ICD-10-CM | POA: Diagnosis not present

## 2018-04-15 DIAGNOSIS — D649 Anemia, unspecified: Secondary | ICD-10-CM | POA: Diagnosis not present

## 2018-04-15 DIAGNOSIS — E872 Acidosis: Secondary | ICD-10-CM | POA: Diagnosis not present

## 2018-04-15 DIAGNOSIS — L7631 Postprocedural hematoma of skin and subcutaneous tissue following a dermatologic procedure: Secondary | ICD-10-CM | POA: Diagnosis not present

## 2018-04-16 DIAGNOSIS — R55 Syncope and collapse: Secondary | ICD-10-CM | POA: Diagnosis not present

## 2018-04-16 DIAGNOSIS — L7631 Postprocedural hematoma of skin and subcutaneous tissue following a dermatologic procedure: Secondary | ICD-10-CM | POA: Diagnosis not present

## 2018-04-16 DIAGNOSIS — E872 Acidosis: Secondary | ICD-10-CM | POA: Diagnosis not present

## 2018-04-16 DIAGNOSIS — R42 Dizziness and giddiness: Secondary | ICD-10-CM | POA: Diagnosis not present

## 2018-04-16 DIAGNOSIS — D62 Acute posthemorrhagic anemia: Secondary | ICD-10-CM | POA: Diagnosis not present

## 2018-04-16 DIAGNOSIS — G8918 Other acute postprocedural pain: Secondary | ICD-10-CM | POA: Diagnosis not present

## 2018-04-16 DIAGNOSIS — E860 Dehydration: Secondary | ICD-10-CM | POA: Diagnosis not present

## 2018-04-17 DIAGNOSIS — R55 Syncope and collapse: Secondary | ICD-10-CM | POA: Diagnosis not present

## 2018-04-17 DIAGNOSIS — R42 Dizziness and giddiness: Secondary | ICD-10-CM | POA: Diagnosis not present

## 2018-04-17 DIAGNOSIS — G8918 Other acute postprocedural pain: Secondary | ICD-10-CM | POA: Diagnosis not present

## 2018-04-17 DIAGNOSIS — E872 Acidosis: Secondary | ICD-10-CM | POA: Diagnosis not present

## 2018-04-17 DIAGNOSIS — E860 Dehydration: Secondary | ICD-10-CM | POA: Diagnosis not present

## 2018-04-17 DIAGNOSIS — D62 Acute posthemorrhagic anemia: Secondary | ICD-10-CM | POA: Diagnosis not present

## 2018-04-17 DIAGNOSIS — L7631 Postprocedural hematoma of skin and subcutaneous tissue following a dermatologic procedure: Secondary | ICD-10-CM | POA: Diagnosis not present

## 2018-04-18 DIAGNOSIS — E860 Dehydration: Secondary | ICD-10-CM | POA: Diagnosis not present

## 2018-04-18 DIAGNOSIS — R42 Dizziness and giddiness: Secondary | ICD-10-CM | POA: Diagnosis not present

## 2018-04-18 DIAGNOSIS — G8918 Other acute postprocedural pain: Secondary | ICD-10-CM | POA: Diagnosis not present

## 2018-04-18 DIAGNOSIS — R55 Syncope and collapse: Secondary | ICD-10-CM | POA: Diagnosis not present

## 2018-05-09 DIAGNOSIS — T8149XA Infection following a procedure, other surgical site, initial encounter: Secondary | ICD-10-CM | POA: Diagnosis not present

## 2018-05-09 DIAGNOSIS — N939 Abnormal uterine and vaginal bleeding, unspecified: Secondary | ICD-10-CM | POA: Diagnosis not present

## 2018-05-11 ENCOUNTER — Telehealth (HOSPITAL_COMMUNITY): Payer: Self-pay | Admitting: *Deleted

## 2018-05-11 NOTE — Telephone Encounter (Signed)
I spoke to Dr. Melton Alar about the pt and the issue she has had. Pt called stating that she had and passed out and she feels like she should have labs done here. Dr Melton Alar recommended the pt follow up with her PCP due to not being seen here in a while and if they continue to go to the ER.   I left a message for the pt stating the above and if she had any questions to call us back.

## 2018-06-28 ENCOUNTER — Other Ambulatory Visit (HOSPITAL_COMMUNITY): Payer: BLUE CROSS/BLUE SHIELD

## 2018-07-05 ENCOUNTER — Inpatient Hospital Stay (HOSPITAL_COMMUNITY): Payer: BLUE CROSS/BLUE SHIELD | Attending: Hematology

## 2018-07-05 DIAGNOSIS — D509 Iron deficiency anemia, unspecified: Secondary | ICD-10-CM | POA: Insufficient documentation

## 2018-07-05 DIAGNOSIS — D500 Iron deficiency anemia secondary to blood loss (chronic): Secondary | ICD-10-CM

## 2018-07-05 LAB — CBC WITH DIFFERENTIAL/PLATELET
Abs Immature Granulocytes: 0.01 10*3/uL (ref 0.00–0.07)
Basophils Absolute: 0 10*3/uL (ref 0.0–0.1)
Basophils Relative: 1 %
Eosinophils Absolute: 0 10*3/uL (ref 0.0–0.5)
Eosinophils Relative: 1 %
HCT: 45.2 % (ref 36.0–46.0)
Hemoglobin: 14.1 g/dL (ref 12.0–15.0)
Immature Granulocytes: 0 %
Lymphocytes Relative: 41 %
Lymphs Abs: 1.5 10*3/uL (ref 0.7–4.0)
MCH: 30.1 pg (ref 26.0–34.0)
MCHC: 31.2 g/dL (ref 30.0–36.0)
MCV: 96.4 fL (ref 80.0–100.0)
Monocytes Absolute: 0.3 10*3/uL (ref 0.1–1.0)
Monocytes Relative: 9 %
Neutro Abs: 1.8 10*3/uL (ref 1.7–7.7)
Neutrophils Relative %: 48 %
Platelets: 165 10*3/uL (ref 150–400)
RBC: 4.69 MIL/uL (ref 3.87–5.11)
RDW: 13.3 % (ref 11.5–15.5)
WBC: 3.7 10*3/uL — ABNORMAL LOW (ref 4.0–10.5)
nRBC: 0 % (ref 0.0–0.2)

## 2018-07-05 LAB — COMPREHENSIVE METABOLIC PANEL
ALT: 16 U/L (ref 0–44)
AST: 18 U/L (ref 15–41)
Albumin: 4.1 g/dL (ref 3.5–5.0)
Alkaline Phosphatase: 40 U/L (ref 38–126)
Anion gap: 4 — ABNORMAL LOW (ref 5–15)
BUN: 14 mg/dL (ref 6–20)
CO2: 27 mmol/L (ref 22–32)
Calcium: 9.2 mg/dL (ref 8.9–10.3)
Chloride: 107 mmol/L (ref 98–111)
Creatinine, Ser: 0.83 mg/dL (ref 0.44–1.00)
GFR calc Af Amer: >60 mL/min (ref >60)
GFR calc non Af Amer: >60 mL/min (ref >60)
Glucose, Bld: 65 mg/dL — ABNORMAL LOW (ref 70–99)
Potassium: 4.4 mmol/L (ref 3.5–5.1)
Sodium: 138 mmol/L (ref 135–145)
Total Bilirubin: 0.6 mg/dL (ref 0.3–1.2)
Total Protein: 7.8 g/dL (ref 6.5–8.1)

## 2018-07-05 LAB — FERRITIN: Ferritin: 113 ng/mL (ref 11–307)

## 2018-07-05 LAB — LACTATE DEHYDROGENASE: LDH: 137 U/L (ref 98–192)

## 2018-09-15 DIAGNOSIS — J019 Acute sinusitis, unspecified: Secondary | ICD-10-CM | POA: Diagnosis not present

## 2018-12-24 ENCOUNTER — Other Ambulatory Visit (HOSPITAL_COMMUNITY): Payer: BLUE CROSS/BLUE SHIELD

## 2018-12-25 ENCOUNTER — Other Ambulatory Visit: Payer: Self-pay

## 2018-12-25 ENCOUNTER — Inpatient Hospital Stay (HOSPITAL_COMMUNITY): Payer: BC Managed Care – PPO | Attending: Internal Medicine

## 2018-12-25 DIAGNOSIS — D509 Iron deficiency anemia, unspecified: Secondary | ICD-10-CM | POA: Insufficient documentation

## 2018-12-25 DIAGNOSIS — D500 Iron deficiency anemia secondary to blood loss (chronic): Secondary | ICD-10-CM

## 2018-12-25 LAB — CBC WITH DIFFERENTIAL/PLATELET
Abs Immature Granulocytes: 0.01 10*3/uL (ref 0.00–0.07)
Basophils Absolute: 0 10*3/uL (ref 0.0–0.1)
Basophils Relative: 1 %
Eosinophils Absolute: 0 10*3/uL (ref 0.0–0.5)
Eosinophils Relative: 0 %
HCT: 44.7 % (ref 36.0–46.0)
Hemoglobin: 14.1 g/dL (ref 12.0–15.0)
Immature Granulocytes: 0 %
Lymphocytes Relative: 37 %
Lymphs Abs: 1.3 10*3/uL (ref 0.7–4.0)
MCH: 30.1 pg (ref 26.0–34.0)
MCHC: 31.5 g/dL (ref 30.0–36.0)
MCV: 95.3 fL (ref 80.0–100.0)
Monocytes Absolute: 0.4 10*3/uL (ref 0.1–1.0)
Monocytes Relative: 11 %
Neutro Abs: 1.7 10*3/uL (ref 1.7–7.7)
Neutrophils Relative %: 51 %
Platelets: 162 10*3/uL (ref 150–400)
RBC: 4.69 MIL/uL (ref 3.87–5.11)
RDW: 13.4 % (ref 11.5–15.5)
WBC: 3.4 10*3/uL — ABNORMAL LOW (ref 4.0–10.5)
nRBC: 0 % (ref 0.0–0.2)

## 2018-12-25 LAB — COMPREHENSIVE METABOLIC PANEL
ALT: 14 U/L (ref 0–44)
AST: 20 U/L (ref 15–41)
Albumin: 3.9 g/dL (ref 3.5–5.0)
Alkaline Phosphatase: 36 U/L — ABNORMAL LOW (ref 38–126)
Anion gap: 8 (ref 5–15)
BUN: 13 mg/dL (ref 6–20)
CO2: 25 mmol/L (ref 22–32)
Calcium: 9 mg/dL (ref 8.9–10.3)
Chloride: 105 mmol/L (ref 98–111)
Creatinine, Ser: 0.8 mg/dL (ref 0.44–1.00)
GFR calc Af Amer: >60 mL/min (ref >60)
GFR calc non Af Amer: >60 mL/min (ref >60)
Glucose, Bld: 68 mg/dL — ABNORMAL LOW (ref 70–99)
Potassium: 3.9 mmol/L (ref 3.5–5.1)
Sodium: 138 mmol/L (ref 135–145)
Total Bilirubin: 0.6 mg/dL (ref 0.3–1.2)
Total Protein: 7.6 g/dL (ref 6.5–8.1)

## 2018-12-25 LAB — LACTATE DEHYDROGENASE: LDH: 132 U/L (ref 98–192)

## 2018-12-25 LAB — FERRITIN: Ferritin: 101 ng/mL (ref 11–307)

## 2018-12-26 DIAGNOSIS — Z20828 Contact with and (suspected) exposure to other viral communicable diseases: Secondary | ICD-10-CM | POA: Diagnosis not present

## 2018-12-26 DIAGNOSIS — U071 COVID-19: Secondary | ICD-10-CM | POA: Diagnosis not present

## 2018-12-26 NOTE — Progress Notes (Signed)
For review.  Please update ordering provider

## 2018-12-31 ENCOUNTER — Inpatient Hospital Stay (HOSPITAL_COMMUNITY): Payer: BC Managed Care – PPO | Admitting: Hematology

## 2018-12-31 ENCOUNTER — Ambulatory Visit (HOSPITAL_COMMUNITY): Payer: BLUE CROSS/BLUE SHIELD | Admitting: Hematology

## 2019-01-29 DIAGNOSIS — Z01419 Encounter for gynecological examination (general) (routine) without abnormal findings: Secondary | ICD-10-CM | POA: Diagnosis not present

## 2019-01-29 DIAGNOSIS — Z6827 Body mass index (BMI) 27.0-27.9, adult: Secondary | ICD-10-CM | POA: Diagnosis not present

## 2019-01-29 DIAGNOSIS — Z1329 Encounter for screening for other suspected endocrine disorder: Secondary | ICD-10-CM | POA: Diagnosis not present

## 2019-01-29 DIAGNOSIS — Z1151 Encounter for screening for human papillomavirus (HPV): Secondary | ICD-10-CM | POA: Diagnosis not present

## 2019-01-29 DIAGNOSIS — Z130 Encounter for screening for diseases of the blood and blood-forming organs and certain disorders involving the immune mechanism: Secondary | ICD-10-CM | POA: Diagnosis not present

## 2019-01-29 DIAGNOSIS — Z1231 Encounter for screening mammogram for malignant neoplasm of breast: Secondary | ICD-10-CM | POA: Diagnosis not present

## 2019-01-29 DIAGNOSIS — Z13220 Encounter for screening for lipoid disorders: Secondary | ICD-10-CM | POA: Diagnosis not present

## 2019-01-29 DIAGNOSIS — Z0000 Encounter for general adult medical examination without abnormal findings: Secondary | ICD-10-CM | POA: Diagnosis not present

## 2019-01-29 DIAGNOSIS — Z131 Encounter for screening for diabetes mellitus: Secondary | ICD-10-CM | POA: Diagnosis not present

## 2019-02-05 ENCOUNTER — Other Ambulatory Visit: Payer: Self-pay

## 2019-02-05 ENCOUNTER — Inpatient Hospital Stay (HOSPITAL_COMMUNITY): Payer: BC Managed Care – PPO | Attending: Internal Medicine | Admitting: Hematology

## 2019-02-05 ENCOUNTER — Encounter (HOSPITAL_COMMUNITY): Payer: Self-pay | Admitting: Hematology

## 2019-02-05 ENCOUNTER — Inpatient Hospital Stay (HOSPITAL_COMMUNITY): Payer: BC Managed Care – PPO

## 2019-02-05 VITALS — BP 114/73 | HR 60 | Temp 98.0°F | Resp 16 | Wt 166.0 lb

## 2019-02-05 DIAGNOSIS — K289 Gastrojejunal ulcer, unspecified as acute or chronic, without hemorrhage or perforation: Secondary | ICD-10-CM | POA: Insufficient documentation

## 2019-02-05 DIAGNOSIS — Z79899 Other long term (current) drug therapy: Secondary | ICD-10-CM | POA: Diagnosis not present

## 2019-02-05 DIAGNOSIS — D709 Neutropenia, unspecified: Secondary | ICD-10-CM | POA: Diagnosis not present

## 2019-02-05 DIAGNOSIS — D696 Thrombocytopenia, unspecified: Secondary | ICD-10-CM

## 2019-02-05 DIAGNOSIS — D508 Other iron deficiency anemias: Secondary | ICD-10-CM | POA: Insufficient documentation

## 2019-02-05 DIAGNOSIS — Z9884 Bariatric surgery status: Secondary | ICD-10-CM | POA: Insufficient documentation

## 2019-02-05 DIAGNOSIS — K912 Postsurgical malabsorption, not elsewhere classified: Secondary | ICD-10-CM | POA: Diagnosis not present

## 2019-02-05 DIAGNOSIS — D509 Iron deficiency anemia, unspecified: Secondary | ICD-10-CM

## 2019-02-05 DIAGNOSIS — D72819 Decreased white blood cell count, unspecified: Secondary | ICD-10-CM | POA: Diagnosis not present

## 2019-02-05 DIAGNOSIS — D500 Iron deficiency anemia secondary to blood loss (chronic): Secondary | ICD-10-CM

## 2019-02-05 LAB — COMPREHENSIVE METABOLIC PANEL
ALT: 13 U/L (ref 0–44)
AST: 18 U/L (ref 15–41)
Albumin: 4 g/dL (ref 3.5–5.0)
Alkaline Phosphatase: 35 U/L — ABNORMAL LOW (ref 38–126)
Anion gap: 7 (ref 5–15)
BUN: 15 mg/dL (ref 6–20)
CO2: 26 mmol/L (ref 22–32)
Calcium: 9.1 mg/dL (ref 8.9–10.3)
Chloride: 106 mmol/L (ref 98–111)
Creatinine, Ser: 0.78 mg/dL (ref 0.44–1.00)
GFR calc Af Amer: >60 mL/min (ref >60)
GFR calc non Af Amer: >60 mL/min (ref >60)
Glucose, Bld: 78 mg/dL (ref 70–99)
Potassium: 3.9 mmol/L (ref 3.5–5.1)
Sodium: 139 mmol/L (ref 135–145)
Total Bilirubin: 0.6 mg/dL (ref 0.3–1.2)
Total Protein: 7.5 g/dL (ref 6.5–8.1)

## 2019-02-05 LAB — FOLATE: Folate: 23.5 ng/mL (ref >5.9)

## 2019-02-05 LAB — CBC WITH DIFFERENTIAL/PLATELET
Abs Immature Granulocytes: 0 10*3/uL (ref 0.00–0.07)
Basophils Absolute: 0 10*3/uL (ref 0.0–0.1)
Basophils Relative: 0 %
Eosinophils Absolute: 0 10*3/uL (ref 0.0–0.5)
Eosinophils Relative: 0 %
HCT: 42 % (ref 36.0–46.0)
Hemoglobin: 13.4 g/dL (ref 12.0–15.0)
Immature Granulocytes: 0 %
Lymphocytes Relative: 41 %
Lymphs Abs: 1.4 10*3/uL (ref 0.7–4.0)
MCH: 30.3 pg (ref 26.0–34.0)
MCHC: 31.9 g/dL (ref 30.0–36.0)
MCV: 95 fL (ref 80.0–100.0)
Monocytes Absolute: 0.4 10*3/uL (ref 0.1–1.0)
Monocytes Relative: 11 %
Neutro Abs: 1.6 10*3/uL — ABNORMAL LOW (ref 1.7–7.7)
Neutrophils Relative %: 48 %
Platelets: 160 10*3/uL (ref 150–400)
RBC: 4.42 MIL/uL (ref 3.87–5.11)
RDW: 13.4 % (ref 11.5–15.5)
WBC: 3.4 10*3/uL — ABNORMAL LOW (ref 4.0–10.5)
nRBC: 0 % (ref 0.0–0.2)

## 2019-02-05 LAB — VITAMIN B12: Vitamin B-12: 210 pg/mL (ref 180–914)

## 2019-02-05 NOTE — Assessment & Plan Note (Signed)
1. Iron deficiency anemia secondary to malabsorption: -  S/P gastric bypass and on chronic PPI therapy.   -  Additionally, she has an anastomotic ulcer that was identified on EGD.   -  Iron transfusion: 05/11/16 -Patient presented today with labs from her primary care provider from January 29, 2019 which revealed 2.7 white blood cell count with no differential, hemoglobin 12.8, platelet count 130 7K.  Neutropenia can be noted in the patient's labs dating back to October 2016.  Thrombocytopenia has not been noted on previous labs.  Today we will do additional lab studies including Hep B/C, HIV, vitamin B12, folate, ANA, blood smear, and flow cytometry to further rule out any underlying etiology of patient's blood abnormalities. - She will return to clinic in 2 weeks to discuss lab results.

## 2019-02-05 NOTE — Progress Notes (Signed)
[facility] [address] [place], [place] [address]   CLINIC:  Medical Oncology/Hematology  PCP:  Patient, No Pcp Per No address on file None   REASON FOR VISIT:  Follow-up for IDA  CURRENT THERAPY: Clinical surveillance    INTERVAL HISTORY:  Ms. Candice Ross 48 y.o. female presents today for follow-up.  She reports overall doing well.  She does report increasing fatigue.  She also reports night sweats that started occurring approximately 6 months ago.  She states the night sweats are drenching and occur 3 out of 4 nights per week.  Denies any lymphadenopathy or weight loss.  Denies any fevers.  Denies any recurrent infections.  She denies any changes in bowel habits no change in appetite.  She is here for repeat labs and office visit.   REVIEW OF SYSTEMS:  Review of Systems  Constitutional: Positive for fatigue.       Nigh sweats    HENT:  Negative.   Eyes: Negative.   Respiratory: Negative.   Cardiovascular: Negative.   Gastrointestinal: Negative.   Endocrine: Negative.   Genitourinary: Negative.    Musculoskeletal: Negative.   Skin: Negative.   Neurological: Negative.   Hematological: Negative.   Psychiatric/Behavioral: Negative.      PAST MEDICAL/SURGICAL HISTORY:  Past Medical History:  Diagnosis Date  . Anemia   . Iron deficiency anemia due to chronic blood loss 12/31/2013  . Ulcerative colitis (HCC)    POSSIBLE   Past Surgical History:  Procedure Laterality Date  . COLONOSCOPY N/A 01/13/2014   Procedure: COLONOSCOPY;  Surgeon: West BaliSandi L Fields, MD;  [facility];  Service: Endoscopy;  Laterality: N/A;  10:30  . ESOPHAGOGASTRODUODENOSCOPY N/A 01/13/2014   Procedure: ESOPHAGOGASTRODUODENOSCOPY (EGD);  Surgeon: West BaliSandi L Fields, MD;  Location: [facility];  Service: Endoscopy;  Laterality: N/A;  . LAPAROSCOPIC GASTRIC BYPASS     [place] 2006     SOCIAL HISTORY:  Social History   Socioeconomic History  . Marital status: Divorced   Spouse name: Not on file  . Number of children: Not on file  . Years of education: Not on file  . Highest education level: Not on file  Occupational History  . Occupation: FirefighterCaterer  . Occupation: Water quality scientistUnit Director for [employer]  Social Needs  . Financial resource strain: Not on file  . Food insecurity    Worry: Not on file    Inability: Not on file  . Transportation needs    Medical: Not on file    Non-medical: Not on file  Tobacco Use  . Smoking status: Never Smoker  . Smokeless tobacco: Never Used  Substance and Sexual Activity  . Alcohol use: No  . Drug use: No  . Sexual activity: Yes    Birth control/protection: I.U.D.  Lifestyle  . Physical activity    Days per week: Not on file    Minutes per session: Not on file  . Stress: Not on file  Relationships  . Social Musicianconnections    Talks on phone: Not on file    Gets together: Not on file    Attends religious service: Not on file    Active member of club or organization: Not on file    Attends meetings of clubs or organizations: Not on file    Relationship status: Not on file  . Intimate partner violence    Fear of current or ex partner: Not on file    Emotionally abused: Not on file    Physically  abused: Not on file    Forced sexual activity: Not on file  Other Topics Concern  . Not on file  Social History Narrative  . Not on file    FAMILY HISTORY:  Family History  Problem Relation Age of Onset  . Colon cancer Neg Hx     CURRENT MEDICATIONS:  No outpatient encounter medications on file as of 02/05/2019.   Facility-Administered Encounter Medications as of 02/05/2019  Medication  . 0.9 %  sodium chloride infusion    ALLERGIES:  Allergies  Allergen Reactions  . Morphine And Related      PHYSICAL EXAM:  ECOG Performance status: 1  Vitals:   02/05/19 1000  BP: 114/73  Pulse: 60  Resp: 16  Temp: 98 F (36.7 C)  SpO2: 99%   Filed Weights   02/05/19 1000  Weight: 166 lb (75.3 kg)    Physical Exam  Constitutional:      Appearance: Normal appearance. She is obese.  HENT:     Head: Normocephalic.     Nose: Nose normal.     Mouth/Throat:     Mouth: Mucous membranes are moist.     Pharynx: Oropharynx is clear.  Eyes:     Extraocular Movements: Extraocular movements intact.     Conjunctiva/sclera: Conjunctivae normal.  Cardiovascular:     Rate and Rhythm: Normal rate and regular rhythm.     Pulses: Normal pulses.     Heart sounds: Normal heart sounds.  Pulmonary:     Effort: Pulmonary effort is normal.     Breath sounds: Normal breath sounds.  Abdominal:     General: Bowel sounds are normal.     Palpations: Abdomen is soft.  Musculoskeletal: Normal range of motion.  Skin:    General: Skin is warm and dry.  Neurological:     General: No focal deficit present.     Mental Status: She is alert and oriented to person, place, and time.  Psychiatric:        Mood and Affect: Mood normal.        Behavior: Behavior normal.        Thought Content: Thought content normal.        Judgment: Judgment normal.      LABORATORY DATA:  I have reviewed the labs as listed.  CBC    Component Value Date/Time   WBC 3.4 (L) 12/25/2018 0921   RBC 4.69 12/25/2018 0921   HGB 14.1 12/25/2018 0921   HGB 8.6 (L) 03/07/2013 1546   HCT 44.7 12/25/2018 0921   HCT 27.9 (L) 03/07/2013 1546   PLT 162 12/25/2018 0921   PLT 299 03/07/2013 1546   MCV 95.3 12/25/2018 0921   MCV 65 (L) 03/07/2013 1546   MCH 30.1 12/25/2018 0921   MCHC 31.5 12/25/2018 0921   RDW 13.4 12/25/2018 0921   RDW 20.7 (H) 03/07/2013 1546   LYMPHSABS 1.3 12/25/2018 0921   MONOABS 0.4 12/25/2018 0921   EOSABS 0.0 12/25/2018 0921   BASOSABS 0.0 12/25/2018 0921   CMP Latest Ref Rng & Units 12/25/2018 07/05/2018 12/22/2017  Glucose 70 - 99 mg/dL 91(Y68(L) 78(G65(L) 956(O114(H)  BUN 6 - 20 mg/dL 13 14 13   Creatinine 0.44 - 1.00 mg/dL 1.300.80 8.650.83 7.840.75  Sodium 135 - 145 mmol/L 138 138 134(L)  Potassium 3.5 - 5.1 mmol/L 3.9 4.4 3.5  Chloride  98 - 111 mmol/L 105 107 105  CO2 22 - 32 mmol/L 25 27 22   Calcium 8.9 - 10.3 mg/dL 9.0 9.2  8.9  Total Protein 6.5 - 8.1 g/dL 7.6 7.8 7.5  Total Bilirubin 0.3 - 1.2 mg/dL 0.6 0.6 0.8  Alkaline Phos 38 - 126 U/L 36(L) 40 37(L)  AST 15 - 41 U/L 20 18 23   ALT 0 - 44 U/L 14 16 16        ASSESSMENT & PLAN:   Iron deficiency anemia due to chronic blood loss 1. Iron deficiency anemia secondary to malabsorption: -  S/P gastric bypass and on chronic PPI therapy.   -  Additionally, she has an anastomotic ulcer that was identified on EGD.   -  Iron transfusion: 05/11/16 -Patient presented today with labs from her primary care provider from January 29, 2019 which revealed 2.7 white blood cell count with no differential, hemoglobin 12.8, platelet count 130 7K.  Neutropenia can be noted in the patient's labs dating back to October 2016.  Thrombocytopenia has not been noted on previous labs.  Today we will do additional lab studies including Hep B/C, HIV, vitamin B12, folate, ANA, blood smear, and flow cytometry to further rule out any underlying etiology of patient's blood abnormalities. - She will return to clinic in 2 weeks to discuss lab results.       Orders placed this encounter:  Orders Placed This Encounter  Procedures  . CBC with Differential  . Comprehensive metabolic panel  . Vitamin B12  . Folate  . ANA, IFA (with reflex)  . Hepatitis B surface antigen  . Hepatitis B surface antibody  . Hepatitis C Antibody  . HIV antibody (with reflex)  . Pathologist smear review  . Flow Cytometry     [place] [phone]

## 2019-02-06 LAB — HIV ANTIBODY (ROUTINE TESTING W REFLEX): HIV Screen 4th Generation wRfx: NONREACTIVE

## 2019-02-06 LAB — PATHOLOGIST SMEAR REVIEW

## 2019-02-06 LAB — HEPATITIS B SURFACE ANTIBODY,QUALITATIVE: Hep B S Ab: NONREACTIVE

## 2019-02-06 LAB — HEPATITIS C ANTIBODY: HCV Ab: <0.1 s/co ratio (ref 0.0–0.9)

## 2019-02-06 LAB — ANTINUCLEAR ANTIBODIES, IFA: ANA Ab, IFA: NEGATIVE

## 2019-02-06 LAB — HEPATITIS B SURFACE ANTIGEN: Hepatitis B Surface Ag: NEGATIVE

## 2019-02-06 NOTE — Progress Notes (Signed)
For review.  Follow-up results and update ordering provider

## 2019-02-19 ENCOUNTER — Inpatient Hospital Stay (HOSPITAL_BASED_OUTPATIENT_CLINIC_OR_DEPARTMENT_OTHER): Payer: BC Managed Care – PPO | Admitting: Hematology

## 2019-02-19 ENCOUNTER — Other Ambulatory Visit: Payer: Self-pay

## 2019-02-19 ENCOUNTER — Encounter (HOSPITAL_COMMUNITY): Payer: Self-pay | Admitting: Hematology

## 2019-02-19 DIAGNOSIS — D696 Thrombocytopenia, unspecified: Secondary | ICD-10-CM | POA: Diagnosis not present

## 2019-02-19 DIAGNOSIS — D509 Iron deficiency anemia, unspecified: Secondary | ICD-10-CM

## 2019-02-19 DIAGNOSIS — D709 Neutropenia, unspecified: Secondary | ICD-10-CM

## 2019-02-19 DIAGNOSIS — K912 Postsurgical malabsorption, not elsewhere classified: Secondary | ICD-10-CM | POA: Diagnosis not present

## 2019-02-19 DIAGNOSIS — Z79899 Other long term (current) drug therapy: Secondary | ICD-10-CM | POA: Diagnosis not present

## 2019-02-19 DIAGNOSIS — D508 Other iron deficiency anemias: Secondary | ICD-10-CM | POA: Diagnosis not present

## 2019-02-19 DIAGNOSIS — D500 Iron deficiency anemia secondary to blood loss (chronic): Secondary | ICD-10-CM

## 2019-02-19 DIAGNOSIS — K289 Gastrojejunal ulcer, unspecified as acute or chronic, without hemorrhage or perforation: Secondary | ICD-10-CM | POA: Diagnosis not present

## 2019-02-19 DIAGNOSIS — Z9884 Bariatric surgery status: Secondary | ICD-10-CM | POA: Diagnosis not present

## 2019-02-19 NOTE — Assessment & Plan Note (Signed)
1. Iron deficiency anemia secondary to malabsorption: -  S/P gastric bypass and on chronic PPI therapy.   -  Additionally, she has an anastomotic ulcer that was identified on EGD.   -  Iron transfusion: 05/11/16 - Labs from her primary care provider from January 29, 2019 which revealed 2.7 white blood cell count with no differential, hemoglobin 12.8, platelet count 130 7K.  Neutropenia can be noted in the patient's labs dating back to October 2016.   -Completed neutropenia work-up on patient.  Hepatitis B, hepatitis C, HIV were negative.  ANA was negative.  Vitamin B12 within normal.  Flow cytometry was negative for any monoclonal B cell population.  Repeat CBC on 02/05/2019 showed white blood cell count had increased to 3.4, neutrophil count 1.6, hemoglobin 13.4, and platelets now within normal iron 60 K.  Neutropenia is most likely benign ethnic etiology.  Recommend to continue to monitor. -Return to clinic in 6 months.

## 2019-02-19 NOTE — Progress Notes (Signed)
[facility] [address] [place], [place] [address]   CLINIC:  Medical Oncology/Hematology  PCP:  Patient, No Pcp Per No address on file None   REASON FOR VISIT:  Follow-up for IDA  CURRENT THERAPY: Clinical surveillance    INTERVAL HISTORY:  Ms. Candice Bodilyrtis 48 y.o. female presents today for follow-up.  She reports overall doing well.  She denies any significant fatigue.  She does report occasional night sweats.  She denies any lymphadenopathy.  No abnormal weight loss or weight gain.  Her appetite is stable.  No abdominal pain.  Bowel habits are within normal.  She is here to review most recent lab work.  REVIEW OF SYSTEMS:  Review of Systems  Constitutional: Positive for fatigue.       Nigh sweats    HENT:  Negative.   Eyes: Negative.   Respiratory: Negative.   Cardiovascular: Negative.   Gastrointestinal: Negative.   Endocrine: Negative.   Genitourinary: Negative.    Musculoskeletal: Negative.   Skin: Negative.   Neurological: Negative.   Hematological: Negative.   Psychiatric/Behavioral: Negative.      PAST MEDICAL/SURGICAL HISTORY:  Past Medical History:  Diagnosis Date  . Anemia   . Iron deficiency anemia due to chronic blood loss 12/31/2013  . Ulcerative colitis (HCC)    POSSIBLE   Past Surgical History:  Procedure Laterality Date  . COLONOSCOPY N/A 01/13/2014   Procedure: COLONOSCOPY;  Surgeon: West BaliSandi L Fields, MD;  [facility];  Service: Endoscopy;  Laterality: N/A;  10:30  . ESOPHAGOGASTRODUODENOSCOPY N/A 01/13/2014   Procedure: ESOPHAGOGASTRODUODENOSCOPY (EGD);  Surgeon: West BaliSandi L Fields, MD;  [facility];  Service: Endoscopy;  Laterality: N/A;  . LAPAROSCOPIC GASTRIC BYPASS     [place] 2006     SOCIAL HISTORY:  Social History   Socioeconomic History  . Marital status: Divorced    Spouse name: Not on file  . Number of children: Not on file  . Years of education: Not on file  . Highest education level: Not on  file  Occupational History  . Occupation: FirefighterCaterer  . Occupation: Water quality scientistUnit Director for [employer]  Social Needs  . Financial resource strain: Not on file  . Food insecurity    Worry: Not on file    Inability: Not on file  . Transportation needs    Medical: Not on file    Non-medical: Not on file  Tobacco Use  . Smoking status: Never Smoker  . Smokeless tobacco: Never Used  Substance and Sexual Activity  . Alcohol use: No  . Drug use: No  . Sexual activity: Yes    Birth control/protection: I.U.D.  Lifestyle  . Physical activity    Days per week: Not on file    Minutes per session: Not on file  . Stress: Not on file  Relationships  . Social Musicianconnections    Talks on phone: Not on file    Gets together: Not on file    Attends religious service: Not on file    Active member of club or organization: Not on file    Attends meetings of clubs or organizations: Not on file    Relationship status: Not on file  . Intimate partner violence    Fear of current or ex partner: Not on file    Emotionally abused: Not on file    Physically abused: Not on file    Forced sexual activity: Not on file  Other Topics Concern  . Not on file  Social History Narrative  . Not on file    FAMILY HISTORY:  Family History  Problem Relation Age of Onset  . Colon cancer Neg Hx     CURRENT MEDICATIONS:  No outpatient encounter medications on file as of 02/19/2019.   Facility-Administered Encounter Medications as of 02/19/2019  Medication  . 0.9 %  sodium chloride infusion    ALLERGIES:  Allergies  Allergen Reactions  . Morphine And Related      PHYSICAL EXAM:  ECOG Performance status: 1  Vitals:   02/19/19 0900  BP: (!) 118/56  Pulse: 66  Resp: 16  Temp: 98 F (36.7 C)  SpO2: 100%   Filed Weights   02/19/19 0900  Weight: 171 lb (77.6 kg)    Physical Exam Constitutional:      Appearance: Normal appearance. She is obese.  HENT:     Head: Normocephalic.     Nose: Nose normal.      Mouth/Throat:     Mouth: Mucous membranes are moist.     Pharynx: Oropharynx is clear.  Eyes:     Extraocular Movements: Extraocular movements intact.     Conjunctiva/sclera: Conjunctivae normal.  Cardiovascular:     Rate and Rhythm: Normal rate and regular rhythm.     Pulses: Normal pulses.     Heart sounds: Normal heart sounds.  Pulmonary:     Effort: Pulmonary effort is normal.     Breath sounds: Normal breath sounds.  Abdominal:     General: Bowel sounds are normal.     Palpations: Abdomen is soft.  Musculoskeletal: Normal range of motion.  Skin:    General: Skin is warm and dry.  Neurological:     General: No focal deficit present.     Mental Status: She is alert and oriented to person, place, and time.  Psychiatric:        Mood and Affect: Mood normal.        Behavior: Behavior normal.        Thought Content: Thought content normal.        Judgment: Judgment normal.      LABORATORY DATA:  I have reviewed the labs as listed.  CBC    Component Value Date/Time   WBC 3.4 (L) 02/05/2019 1219   RBC 4.42 02/05/2019 1219   HGB 13.4 02/05/2019 1219   HGB 8.6 (L) 03/07/2013 1546   HCT 42.0 02/05/2019 1219   HCT 27.9 (L) 03/07/2013 1546   PLT 160 02/05/2019 1219   PLT 299 03/07/2013 1546   MCV 95.0 02/05/2019 1219   MCV 65 (L) 03/07/2013 1546   MCH 30.3 02/05/2019 1219   MCHC 31.9 02/05/2019 1219   RDW 13.4 02/05/2019 1219   RDW 20.7 (H) 03/07/2013 1546   LYMPHSABS 1.4 02/05/2019 1219   MONOABS 0.4 02/05/2019 1219   EOSABS 0.0 02/05/2019 1219   BASOSABS 0.0 02/05/2019 1219   CMP Latest Ref Rng & Units 02/05/2019 12/25/2018 07/05/2018  Glucose 70 - 99 mg/dL 78 16(X68(L) 09(U65(L)  BUN 6 - 20 mg/dL 15 13 14   Creatinine 0.44 - 1.00 mg/dL 0.450.78 4.090.80 8.110.83  Sodium 135 - 145 mmol/L 139 138 138  Potassium 3.5 - 5.1 mmol/L 3.9 3.9 4.4  Chloride 98 - 111 mmol/L 106 105 107  CO2 22 - 32 mmol/L 26 25 27   Calcium 8.9 - 10.3 mg/dL 9.1 9.0 9.2  Total Protein 6.5 - 8.1 g/dL 7.5 7.6  7.8  Total Bilirubin 0.3 - 1.2 mg/dL 0.6 0.6 0.6  Alkaline Phos 38 - 126 U/L 35(L) 36(L) 40  AST 15 - 41 U/L 18 20 18   ALT 0 - 44 U/L 13 14 16        ASSESSMENT & PLAN:   Iron deficiency anemia due to chronic blood loss 1. Iron deficiency anemia secondary to malabsorption: -  S/P gastric bypass and on chronic PPI therapy.   -  Additionally, she has an anastomotic ulcer that was identified on EGD.   -  Iron transfusion: 05/11/16 - Labs from her primary care provider from January 29, 2019 which revealed 2.7 white blood cell count with no differential, hemoglobin 12.8, platelet count 130 7K.  Neutropenia can be noted in the patient's labs dating back to October 2016.   -Completed neutropenia work-up on patient.  Hepatitis B, hepatitis C, HIV were negative.  ANA was negative.  Vitamin B12 within normal.  Flow cytometry was negative for any monoclonal B cell population.  Repeat CBC on 02/05/2019 showed white blood cell count had increased to 3.4, neutrophil count 1.6, hemoglobin 13.4, and platelets now within normal iron 60 K.  Neutropenia is most likely benign ethnic etiology.  Recommend to continue to monitor. -Return to clinic in 6 months.       Orders placed this encounter:  Orders Placed This Encounter  Procedures  . CBC with Differential  . Comprehensive metabolic panel  . Ferritin  . Iron and TIBC  . Vitamin B12  . [place] [phone]

## 2019-03-01 ENCOUNTER — Other Ambulatory Visit: Payer: Self-pay

## 2019-03-01 DIAGNOSIS — Z20822 Contact with and (suspected) exposure to covid-19: Secondary | ICD-10-CM

## 2019-03-02 LAB — NOVEL CORONAVIRUS, NAA: SARS-CoV-2, NAA: NOT DETECTED

## 2019-06-06 DIAGNOSIS — Z113 Encounter for screening for infections with a predominantly sexual mode of transmission: Secondary | ICD-10-CM | POA: Diagnosis not present

## 2019-06-12 DIAGNOSIS — R1031 Right lower quadrant pain: Secondary | ICD-10-CM | POA: Diagnosis not present

## 2019-06-12 DIAGNOSIS — Z30431 Encounter for routine checking of intrauterine contraceptive device: Secondary | ICD-10-CM | POA: Diagnosis not present

## 2019-06-12 DIAGNOSIS — R102 Pelvic and perineal pain: Secondary | ICD-10-CM | POA: Diagnosis not present

## 2019-08-16 ENCOUNTER — Other Ambulatory Visit (HOSPITAL_COMMUNITY): Payer: Self-pay

## 2019-08-16 DIAGNOSIS — D72819 Decreased white blood cell count, unspecified: Secondary | ICD-10-CM

## 2019-08-16 DIAGNOSIS — D500 Iron deficiency anemia secondary to blood loss (chronic): Secondary | ICD-10-CM

## 2019-08-19 ENCOUNTER — Inpatient Hospital Stay (HOSPITAL_COMMUNITY): Payer: BC Managed Care – PPO | Attending: Hematology

## 2019-08-26 ENCOUNTER — Ambulatory Visit (HOSPITAL_COMMUNITY): Payer: BC Managed Care – PPO | Admitting: Hematology

## 2019-08-27 ENCOUNTER — Other Ambulatory Visit: Payer: Self-pay

## 2019-08-27 ENCOUNTER — Inpatient Hospital Stay (HOSPITAL_COMMUNITY): Payer: BC Managed Care – PPO | Attending: Hematology

## 2019-08-27 ENCOUNTER — Ambulatory Visit (HOSPITAL_COMMUNITY): Payer: BC Managed Care – PPO | Admitting: Nurse Practitioner

## 2019-08-27 DIAGNOSIS — K289 Gastrojejunal ulcer, unspecified as acute or chronic, without hemorrhage or perforation: Secondary | ICD-10-CM | POA: Diagnosis not present

## 2019-08-27 DIAGNOSIS — D72819 Decreased white blood cell count, unspecified: Secondary | ICD-10-CM | POA: Insufficient documentation

## 2019-08-27 DIAGNOSIS — K912 Postsurgical malabsorption, not elsewhere classified: Secondary | ICD-10-CM | POA: Insufficient documentation

## 2019-08-27 DIAGNOSIS — Z9884 Bariatric surgery status: Secondary | ICD-10-CM | POA: Diagnosis not present

## 2019-08-27 DIAGNOSIS — D500 Iron deficiency anemia secondary to blood loss (chronic): Secondary | ICD-10-CM

## 2019-08-27 DIAGNOSIS — Z79899 Other long term (current) drug therapy: Secondary | ICD-10-CM | POA: Diagnosis not present

## 2019-08-27 LAB — CBC WITH DIFFERENTIAL/PLATELET
Abs Immature Granulocytes: 0 10*3/uL (ref 0.00–0.07)
Basophils Absolute: 0 10*3/uL (ref 0.0–0.1)
Basophils Relative: 1 %
Eosinophils Absolute: 0 10*3/uL (ref 0.0–0.5)
Eosinophils Relative: 1 %
HCT: 46 % (ref 36.0–46.0)
Hemoglobin: 14.2 g/dL (ref 12.0–15.0)
Immature Granulocytes: 0 %
Lymphocytes Relative: 40 %
Lymphs Abs: 1.2 10*3/uL (ref 0.7–4.0)
MCH: 30 pg (ref 26.0–34.0)
MCHC: 30.9 g/dL (ref 30.0–36.0)
MCV: 97 fL (ref 80.0–100.0)
Monocytes Absolute: 0.3 10*3/uL (ref 0.1–1.0)
Monocytes Relative: 11 %
Neutro Abs: 1.4 10*3/uL — ABNORMAL LOW (ref 1.7–7.7)
Neutrophils Relative %: 47 %
Platelets: 171 10*3/uL (ref 150–400)
RBC: 4.74 MIL/uL (ref 3.87–5.11)
RDW: 12.5 % (ref 11.5–15.5)
WBC: 3 10*3/uL — ABNORMAL LOW (ref 4.0–10.5)
nRBC: 0 % (ref 0.0–0.2)

## 2019-08-27 LAB — COMPREHENSIVE METABOLIC PANEL
ALT: 15 U/L (ref 0–44)
AST: 21 U/L (ref 15–41)
Albumin: 4.2 g/dL (ref 3.5–5.0)
Alkaline Phosphatase: 38 U/L (ref 38–126)
Anion gap: 6 (ref 5–15)
BUN: 17 mg/dL (ref 6–20)
CO2: 29 mmol/L (ref 22–32)
Calcium: 9.5 mg/dL (ref 8.9–10.3)
Chloride: 107 mmol/L (ref 98–111)
Creatinine, Ser: 0.9 mg/dL (ref 0.44–1.00)
GFR calc Af Amer: >60 mL/min (ref >60)
GFR calc non Af Amer: >60 mL/min (ref >60)
Glucose, Bld: 69 mg/dL — ABNORMAL LOW (ref 70–99)
Potassium: 4.5 mmol/L (ref 3.5–5.1)
Sodium: 142 mmol/L (ref 135–145)
Total Bilirubin: 0.8 mg/dL (ref 0.3–1.2)
Total Protein: 8.2 g/dL — ABNORMAL HIGH (ref 6.5–8.1)

## 2019-08-27 LAB — FERRITIN: Ferritin: 143 ng/mL (ref 11–307)

## 2019-08-27 LAB — LACTATE DEHYDROGENASE: LDH: 153 U/L (ref 98–192)

## 2019-08-27 LAB — VITAMIN B12: Vitamin B-12: 225 pg/mL (ref 180–914)

## 2019-08-27 LAB — FOLATE: Folate: 16.6 ng/mL (ref >5.9)

## 2019-08-29 ENCOUNTER — Inpatient Hospital Stay (HOSPITAL_BASED_OUTPATIENT_CLINIC_OR_DEPARTMENT_OTHER): Payer: BC Managed Care – PPO | Admitting: Nurse Practitioner

## 2019-08-29 ENCOUNTER — Other Ambulatory Visit: Payer: Self-pay

## 2019-08-29 DIAGNOSIS — D508 Other iron deficiency anemias: Secondary | ICD-10-CM | POA: Diagnosis not present

## 2019-08-29 DIAGNOSIS — Z980 Intestinal bypass and anastomosis status: Secondary | ICD-10-CM | POA: Diagnosis not present

## 2019-08-29 DIAGNOSIS — D500 Iron deficiency anemia secondary to blood loss (chronic): Secondary | ICD-10-CM

## 2019-08-29 NOTE — Assessment & Plan Note (Signed)
1.  Iron deficiency anemia secondary to malabsorption: -Status post gastric bypass on chronic PPI therapy. -Additionally she has an anastomotic ulcer that was identified on EGD. -Last iron transfusion was on 05/11/2016. -Labs done on 08/27/2019 showed hemoglobin 14.2, ferritin 143, platelets 171 -I do not recommend any IV iron at this time. -We will see her back in 6 months with repeat labs.   2.  Leukopenia: -Labs from her PCP revealed WBC 2.7.  Neutropenia can be noted in the patient's labs dating back to October 2016. -Complete neutropenia work-up on patient.  Hepatitis B, hepatitis C, HIV were negative.  ANA was negative.  Vitamin B12 was normal.  Flow cytometry was negative for any monoclonal B-cell population. -Repeat CBC on 02/05/2019 showed WBC had increased to 3.4, neutrophil count 1.6, hemoglobin 13.4, and platelets now within normal limits. -Neutropenia is most likely benign ethnic etiology. -Labs done on 08/27/2019 showed WBC 3.0 -Recommended continued to monitor.

## 2019-08-29 NOTE — Progress Notes (Signed)
[facility] [address], [place] [address]   CLINIC:  Medical Oncology/Hematology  PCP:  Patient, No Pcp Per No address on file None   REASON FOR VISIT: Follow-up for iron deficiency anemia  CURRENT THERAPY: Intermittent iron infusions   INTERVAL HISTORY:  Candice Ross 49 y.o. female was called for a telephone visit today due to her iron deficiency anemia.  Patient reports she has done well since her last visit.  She has no new extreme fatigue.  She denies any bright red bleeding per rectum or melena.  She denies any easy bruising or bleeding. Denies any nausea, vomiting, or diarrhea. Denies any new pains. Had not noticed any recent bleeding such as epistaxis, hematuria or hematochezia. Denies recent chest pain on exertion, shortness of breath on minimal exertion, pre-syncopal episodes, or palpitations. Denies any numbness or tingling in hands or feet. Denies any recent fevers, infections, or recent hospitalizations. Patient reports appetite at 100% and energy level at 75%.  She is eating well maintaining her weight at this time.    REVIEW OF SYSTEMS:  Review of Systems  All other systems reviewed and are negative.    PAST MEDICAL/SURGICAL HISTORY:  Past Medical History:  Diagnosis Date  . Anemia   . Iron deficiency anemia due to chronic blood loss 12/31/2013  . Ulcerative colitis (HCC)    POSSIBLE   Past Surgical History:  Procedure Laterality Date  . COLONOSCOPY N/A 01/13/2014   Procedure: COLONOSCOPY;  Surgeon: West Bali, MD;  [facility];  Service: Endoscopy;  Laterality: N/A;  10:30  . ESOPHAGOGASTRODUODENOSCOPY N/A 01/13/2014   Procedure: ESOPHAGOGASTRODUODENOSCOPY (EGD);  Surgeon: West Bali, MD;  [facility];  Service: Endoscopy;  Laterality: N/A;  . [place]OSCOPIC GASTRIC BYPASS     [place]     SOCIAL HISTORY:  Social History   Socioeconomic History  . Marital status: Divorced    Spouse name: Not on  file  . Number of children: Not on file  . Years of education: Not on file  . Highest education level: Not on file  Occupational History  . Occupation: Firefighter  . Occupation: Water quality scientist for [employer]  . Smoking status: Never Smoker  . Smokeless tobacco: Never Used  Substance and Sexual Activity  . Alcohol use: No  . Drug use: No  . Sexual activity: Yes    Birth control/protection: I.U.D.  Other Topics Concern  . Not on file  Social History Narrative  . Not on file   Social Determinants of Health   Financial Resource Strain:   . Difficulty of Paying Living Expenses: Not on file  Food Insecurity:   . Worried About Programme researcher, broadcasting/film/video in the Last Year: Not on file  . Ran Out of Food in the Last Year: Not on file  Transportation Needs:   . Lack of Transportation (Medical): Not on file  . Lack of Transportation (Non-Medical): Not on file  Physical Activity:   . Days of Exercise per Week: Not on file  . Minutes of Exercise per Session: Not on file  Stress:   . Feeling of Stress : Not on file  Social Connections:   . Frequency of Communication with Friends and Family: Not on file  . Frequency of Social Gatherings with Friends and Family: Not on file  . Attends Religious Services: Not on file  . Active Member of Clubs or Organizations: Not on file  . Attends Club  or Organization Meetings: Not on file  . Marital Status: Not on file  Intimate Partner Violence:   . Fear of Current or Ex-Partner: Not on file  . Emotionally Abused: Not on file  . Physically Abused: Not on file  . Sexually Abused: Not on file    FAMILY HISTORY:  Family History  Problem Relation Age of Onset  . Colon cancer Neg Hx     CURRENT MEDICATIONS:  No outpatient encounter medications on file as of 08/29/2019.   Facility-Administered Encounter Medications as of 08/29/2019  Medication  . 0.9 %  sodium chloride infusion    ALLERGIES:  Allergies  Allergen Reactions  . Morphine And  Related      Vital signs: -Deferred due to telephone visit  Physical Exam -Deferred due to telephone visit -Patient was alert and oriented over the phone and in no acute distress.  LABORATORY DATA:  I have reviewed the labs as listed.  CBC    Component Value Date/Time   WBC 3.0 (L) 08/27/2019 0932   RBC 4.74 08/27/2019 0932   HGB 14.2 08/27/2019 0932   HGB 8.6 (L) 03/07/2013 1546   HCT 46.0 08/27/2019 0932   HCT 27.9 (L) 03/07/2013 1546   PLT 171 08/27/2019 0932   PLT 299 03/07/2013 1546   MCV 97.0 08/27/2019 0932   MCV 65 (L) 03/07/2013 1546   MCH 30.0 08/27/2019 0932   MCHC 30.9 08/27/2019 0932   RDW 12.5 08/27/2019 0932   RDW 20.7 (H) 03/07/2013 1546   LYMPHSABS 1.2 08/27/2019 0932   MONOABS 0.3 08/27/2019 0932   EOSABS 0.0 08/27/2019 0932   BASOSABS 0.0 08/27/2019 0932   CMP Latest Ref Rng & Units 08/27/2019 02/05/2019 12/25/2018  Glucose 70 - 99 mg/dL 69(L) 78 68(L)  BUN 6 - 20 mg/dL 17 15 13   Creatinine 0.44 - 1.00 mg/dL 0.90 0.78 0.80  Sodium 135 - 145 mmol/L 142 139 138  Potassium 3.5 - 5.1 mmol/L 4.5 3.9 3.9  Chloride 98 - 111 mmol/L 107 106 105  CO2 22 - 32 mmol/L 29 26 25   Calcium 8.9 - 10.3 mg/dL 9.5 9.1 9.0  Total Protein 6.5 - 8.1 g/dL 8.2(H) 7.5 7.6  Total Bilirubin 0.3 - 1.2 mg/dL 0.8 0.6 0.6  Alkaline Phos 38 - 126 U/L 38 35(L) 36(L)  AST 15 - 41 U/L 21 18 20   ALT 0 - 44 U/L 15 13 14     All questions were answered to patient's stated satisfaction. Encouraged patient to call with any new concerns or questions before his next visit to the cancer center and we can certain see her sooner, if needed.      ASSESSMENT & PLAN:   Iron deficiency anemia due to chronic blood loss 1.  Iron deficiency anemia secondary to malabsorption: -Status post gastric bypass on chronic PPI therapy. -Additionally she has an anastomotic ulcer that was identified on EGD. -Last iron transfusion was on 05/11/2016. -Labs done on 08/27/2019 showed hemoglobin 14.2, ferritin  143, platelets 171 -I do not recommend any IV iron at this time. -We will see her back in 6 months with repeat labs.   2.  Leukopenia: -Labs from her PCP revealed WBC 2.7.  Neutropenia can be noted in the patient's labs dating back to October 2016. -Complete neutropenia work-up on patient.  Hepatitis B, hepatitis C, HIV were negative.  ANA was negative.  Vitamin B12 was normal.  Flow cytometry was negative for any monoclonal B-cell population. -Repeat CBC on 02/05/2019 showed WBC  had increased to 3.4, neutrophil count 1.6, hemoglobin 13.4, and platelets now within normal limits. -Neutropenia is most likely benign ethnic etiology. -Labs done on 08/27/2019 showed WBC 3.0 -Recommended continued to monitor.       Orders placed this encounter:  Orders Placed This Encounter  Procedures  . Lactate dehydrogenase  . CBC with Differential/Platelet  . Comprehensive metabolic panel  . Ferritin  . Iron and TIBC  . Vitamin B12  . VITAMIN D 25 Hydroxy (Vit-D Deficiency, Fractures)    I provided 20 minutes of non face-to-face telephone visit time during this encounter, and > 50% was spent counseling as documented under my assessment & plan.  Mathis Bud, FNP-C [facility] [phone]

## 2019-08-29 NOTE — Patient Instructions (Signed)
[facility] at [facility] Discharge Instructions  Follow up in 6 months with labs    Thank you for choosing [facility] at [facility] to provide your oncology and hematology care.  To afford each patient quality time with our provider, please arrive at least 15 minutes before your scheduled appointment time.   If you have a lab appointment with the Cancer Center please come in thru the Main Entrance and check in at the main information desk.  You need to re-schedule your appointment should you arrive 10 or more minutes late.  We strive to give you quality time with our providers, and arriving late affects you and other patients whose appointments are after yours.  Also, if you no show three or more times for appointments you may be dismissed from the clinic at the providers discretion.     Again, thank you for choosing [facility].  Our hope is that these requests will decrease the amount of time that you wait before being seen by our physicians.       _____________________________________________________________  Should you have questions after your visit to [facility], please contact our office at [phone] between the hours of 8:00 a.m. and 4:30 p.m.  Voicemails left after 4:00 p.m. will not be returned until the following business day.  For prescription refill requests, have your pharmacy contact our office and allow 72 hours.    Due to Covid, you will need to wear a mask upon entering the hospital. If you do not have a mask, a mask will be given to you at the Main Entrance upon arrival. For doctor visits, patients may have 1 support person with them. For treatment visits, patients can not have anyone with them due to social distancing guidelines and our immunocompromised population.      

## 2020-02-20 ENCOUNTER — Inpatient Hospital Stay (HOSPITAL_COMMUNITY): Payer: BC Managed Care – PPO | Attending: Hematology

## 2020-02-27 ENCOUNTER — Telehealth (HOSPITAL_COMMUNITY): Payer: BC Managed Care – PPO | Admitting: Nurse Practitioner

## 2020-03-19 ENCOUNTER — Inpatient Hospital Stay (HOSPITAL_COMMUNITY): Payer: BC Managed Care – PPO | Attending: Hematology

## 2020-03-19 ENCOUNTER — Other Ambulatory Visit: Payer: Self-pay

## 2020-03-19 DIAGNOSIS — K909 Intestinal malabsorption, unspecified: Secondary | ICD-10-CM | POA: Diagnosis not present

## 2020-03-19 DIAGNOSIS — D500 Iron deficiency anemia secondary to blood loss (chronic): Secondary | ICD-10-CM

## 2020-03-19 DIAGNOSIS — D508 Other iron deficiency anemias: Secondary | ICD-10-CM | POA: Diagnosis not present

## 2020-03-19 LAB — CBC WITH DIFFERENTIAL/PLATELET
Abs Immature Granulocytes: 0 10*3/uL (ref 0.00–0.07)
Basophils Absolute: 0 10*3/uL (ref 0.0–0.1)
Basophils Relative: 1 %
Eosinophils Absolute: 0 10*3/uL (ref 0.0–0.5)
Eosinophils Relative: 1 %
HCT: 40.7 % (ref 36.0–46.0)
Hemoglobin: 13 g/dL (ref 12.0–15.0)
Immature Granulocytes: 0 %
Lymphocytes Relative: 42 %
Lymphs Abs: 1.2 10*3/uL (ref 0.7–4.0)
MCH: 31.2 pg (ref 26.0–34.0)
MCHC: 31.9 g/dL (ref 30.0–36.0)
MCV: 97.6 fL (ref 80.0–100.0)
Monocytes Absolute: 0.3 10*3/uL (ref 0.1–1.0)
Monocytes Relative: 10 %
Neutro Abs: 1.4 10*3/uL — ABNORMAL LOW (ref 1.7–7.7)
Neutrophils Relative %: 46 %
Platelets: 176 10*3/uL (ref 150–400)
RBC: 4.17 MIL/uL (ref 3.87–5.11)
RDW: 13.8 % (ref 11.5–15.5)
WBC: 2.9 10*3/uL — ABNORMAL LOW (ref 4.0–10.5)
nRBC: 0 % (ref 0.0–0.2)

## 2020-03-19 LAB — IRON AND TIBC
Iron: 90 ug/dL (ref 28–170)
Saturation Ratios: 29 % (ref 10.4–31.8)
TIBC: 313 ug/dL (ref 250–450)
UIBC: 223 ug/dL

## 2020-03-19 LAB — COMPREHENSIVE METABOLIC PANEL
ALT: 13 U/L (ref 0–44)
AST: 18 U/L (ref 15–41)
Albumin: 4 g/dL (ref 3.5–5.0)
Alkaline Phosphatase: 34 U/L — ABNORMAL LOW (ref 38–126)
Anion gap: 6 (ref 5–15)
BUN: 18 mg/dL (ref 6–20)
CO2: 24 mmol/L (ref 22–32)
Calcium: 8.7 mg/dL — ABNORMAL LOW (ref 8.9–10.3)
Chloride: 107 mmol/L (ref 98–111)
Creatinine, Ser: 0.78 mg/dL (ref 0.44–1.00)
GFR calc Af Amer: >60 mL/min (ref >60)
GFR calc non Af Amer: >60 mL/min (ref >60)
Glucose, Bld: 76 mg/dL (ref 70–99)
Potassium: 4.1 mmol/L (ref 3.5–5.1)
Sodium: 137 mmol/L (ref 135–145)
Total Bilirubin: 0.8 mg/dL (ref 0.3–1.2)
Total Protein: 7.5 g/dL (ref 6.5–8.1)

## 2020-03-19 LAB — VITAMIN D 25 HYDROXY (VIT D DEFICIENCY, FRACTURES): Vit D, 25-Hydroxy: 24.51 ng/mL — ABNORMAL LOW (ref 30–100)

## 2020-03-19 LAB — FERRITIN: Ferritin: 73 ng/mL (ref 11–307)

## 2020-03-19 LAB — LACTATE DEHYDROGENASE: LDH: 146 U/L (ref 98–192)

## 2020-03-19 LAB — VITAMIN B12: Vitamin B-12: 182 pg/mL (ref 180–914)

## 2020-03-24 ENCOUNTER — Inpatient Hospital Stay (HOSPITAL_BASED_OUTPATIENT_CLINIC_OR_DEPARTMENT_OTHER): Payer: BC Managed Care – PPO | Admitting: Nurse Practitioner

## 2020-03-24 ENCOUNTER — Other Ambulatory Visit: Payer: Self-pay

## 2020-03-24 ENCOUNTER — Other Ambulatory Visit (HOSPITAL_COMMUNITY): Payer: Self-pay | Admitting: Nurse Practitioner

## 2020-03-24 DIAGNOSIS — D500 Iron deficiency anemia secondary to blood loss (chronic): Secondary | ICD-10-CM

## 2020-03-24 NOTE — Assessment & Plan Note (Signed)
1.  Iron deficiency anemia secondary to malabsorption: -Status post gastric bypass on chronic PPI therapy. -Additionally she has an anastomotic ulcer that was identified on EGD. -Last iron transfusion was on 05/11/2016. -Labs done on 03/19/2020 showed hemoglobin 13.9, ferritin 73, percent saturation 29 -Due to her extreme fatigue we will set her up with 2 infusions of IV iron. -We will see her back in 5 months with repeat labs.   2.  Leukopenia: -Labs from her PCP revealed WBC 2.7.  Neutropenia can be noted in the patient's labs dating back to October 2016. -Complete neutropenia work-up on patient.  Hepatitis B, hepatitis C, HIV were negative.  ANA was negative.  Vitamin B12 was normal.  Flow cytometry was negative for any monoclonal B-cell population. -Neutropenia is most likely benign ethnic etiology. -Labs done on 03/19/2020 showed WBC 2.9, hemoglobin 13.0, platelets 176 -Recommended continued to monitor.

## 2020-03-24 NOTE — Progress Notes (Signed)
[facility] Cancer Follow up:    Patient, No Pcp Per No address on file   DIAGNOSIS: Iron deficiency anemia  CURRENT THERAPY: Intermittent iron infusions  INTERVAL HISTORY: Candice Ross 49 y.o. female was called for telephone visit for iron deficiency anemia.  Patient reports she is feeling very fatigued lately.  She denies any bright red bleeding per rectum or melena.  She denies any easy bruising or bleeding. Denies any nausea, vomiting, or diarrhea. Denies any new pains. Had not noticed any recent bleeding such as epistaxis, hematuria or hematochezia. Denies recent chest pain on exertion, shortness of breath on minimal exertion, pre-syncopal episodes, or palpitations. Denies any numbness or tingling in hands or feet. Denies any recent fevers, infections, or recent hospitalizations. Patient reports appetite at 100% and energy level at 25%.    Patient Active Problem List   Diagnosis Date Noted  . Vitamin D insufficiency 05/04/2016  . Low serum vitamin B12 05/04/2016  . Irritable bowel syndrome with constipation 01/31/2014  . Status post gastric bypass for obesity 01/31/2014  . Iron deficiency anemia due to chronic blood loss 12/31/2013  . Abdominal pain, unspecified site 12/31/2013  . GERD (gastroesophageal reflux disease) 12/31/2013    is allergic to morphine and related.  MEDICAL HISTORY: Past Medical History:  Diagnosis Date  . Anemia   . Iron deficiency anemia due to chronic blood loss 12/31/2013  . Ulcerative colitis (HCC)    POSSIBLE    SURGICAL HISTORY: Past Surgical History:  Procedure Laterality Date  . COLONOSCOPY N/A 01/13/2014   Procedure: COLONOSCOPY;  Surgeon: West Bali, MD;  [facility];  Service: Endoscopy;  Laterality: N/A;  10:30  . ESOPHAGOGASTRODUODENOSCOPY N/A 01/13/2014   Procedure: ESOPHAGOGASTRODUODENOSCOPY (EGD);  Surgeon: West Bali, MD;  [facility];  Service: Endoscopy;  Laterality: N/A;   . LAPAROSCOPIC GASTRIC BYPASS     [place]    SOCIAL HISTORY: Social History   Socioeconomic History  . Marital status: Divorced    Spouse name: Not on file  . Number of children: Not on file  . Years of education: Not on file  . Highest education level: Not on file  Occupational History  . Occupation: Firefighter  . Occupation: Water quality scientist for [employer]  . Smoking status: Never Smoker  . Smokeless tobacco: Never Used  Substance and Sexual Activity  . Alcohol use: No  . Drug use: No  . Sexual activity: Yes    Birth control/protection: I.U.D.  Other Topics Concern  . Not on file  Social History Narrative  . Not on file   Social Determinants of Health   Financial Resource Strain:   . Difficulty of Paying Living Expenses: Not on file  Food Insecurity:   . Worried About Programme researcher, broadcasting/film/video in the Last Year: Not on file  . Ran Out of Food in the Last Year: Not on file  Transportation Needs:   . Lack of Transportation (Medical): Not on file  . Lack of Transportation (Non-Medical): Not on file  Physical Activity:   . Days of Exercise per Week: Not on file  . Minutes of Exercise per Session: Not on file  Stress:   . Feeling of Stress : Not on file  Social Connections:   . Frequency of Communication with Friends and Family: Not on file  . Frequency of Social Gatherings with Friends and Family: Not on file  . Attends Religious Services: Not on file  .  Active Member of Clubs or Organizations: Not on file  . Attends Banker Meetings: Not on file  . Marital Status: Not on file  Intimate Partner Violence:   . Fear of Current or Ex-Partner: Not on file  . Emotionally Abused: Not on file  . Physically Abused: Not on file  . Sexually Abused: Not on file    FAMILY HISTORY: Family History  Problem Relation Age of Onset  . Colon cancer Neg Hx     Review of Systems  Constitutional: Positive for fatigue.  All other systems reviewed and are  negative.   Vital signs: -Deferred due to telephone visit  Physical Exam -Deferred due to telephone visit -Was alert and oriented over the phone in no acute distress   LABORATORY DATA:  CBC    Component Value Date/Time   WBC 2.9 (L) 03/19/2020 0907   RBC 4.17 03/19/2020 0907   HGB 13.0 03/19/2020 0907   HGB 8.6 (L) 03/07/2013 1546   HCT 40.7 03/19/2020 0907   HCT 27.9 (L) 03/07/2013 1546   PLT 176 03/19/2020 0907   PLT 299 03/07/2013 1546   MCV 97.6 03/19/2020 0907   MCV 65 (L) 03/07/2013 1546   MCH 31.2 03/19/2020 0907   MCHC 31.9 03/19/2020 0907   RDW 13.8 03/19/2020 0907   RDW 20.7 (H) 03/07/2013 1546   LYMPHSABS 1.2 03/19/2020 0907   MONOABS 0.3 03/19/2020 0907   EOSABS 0.0 03/19/2020 0907   BASOSABS 0.0 03/19/2020 0907    CMP     Component Value Date/Time   NA 137 03/19/2020 0907   NA 139 03/07/2013 1546   K 4.1 03/19/2020 0907   K 3.7 03/07/2013 1546   CL 107 03/19/2020 0907   CL 108 (H) 03/07/2013 1546   CO2 24 03/19/2020 0907   CO2 27 03/07/2013 1546   GLUCOSE 76 03/19/2020 0907   GLUCOSE 100 (H) 03/07/2013 1546   BUN 18 03/19/2020 0907   BUN 10 03/07/2013 1546   CREATININE 0.78 03/19/2020 0907   CREATININE 0.83 03/07/2013 1546   CALCIUM 8.7 (L) 03/19/2020 0907   CALCIUM 8.7 03/07/2013 1546   PROT 7.5 03/19/2020 0907   PROT 8.6 (H) 03/07/2013 1546   ALBUMIN 4.0 03/19/2020 0907   ALBUMIN 3.4 03/07/2013 1546   AST 18 03/19/2020 0907   AST 24 03/07/2013 1546   ALT 13 03/19/2020 0907   ALT 16 03/07/2013 1546   ALKPHOS 34 (L) 03/19/2020 0907   ALKPHOS 82 03/07/2013 1546   BILITOT 0.8 03/19/2020 0907   BILITOT 0.2 03/07/2013 1546   GFRNONAA >60 03/19/2020 0907   GFRNONAA >60 03/07/2013 1546   GFRAA >60 03/19/2020 0907   GFRAA >60 03/07/2013 1546   All questions were answered to patient's stated satisfaction. Encouraged patient to call with any new concerns or questions before his next visit to the cancer center and we can certain see him  sooner, if needed.     ASSESSMENT and THERAPY PLAN:   Iron deficiency anemia due to chronic blood loss 1.  Iron deficiency anemia secondary to malabsorption: -Status post gastric bypass on chronic PPI therapy. -Additionally she has an anastomotic ulcer that was identified on EGD. -Last iron transfusion was on 05/11/2016. -Labs done on 03/19/2020 showed hemoglobin 13.9, ferritin 73, percent saturation 29 -Due to her extreme fatigue we will set her up with 2 infusions of IV iron. -We will see her back in 5 months with repeat labs.   2.  Leukopenia: -Labs from her PCP  revealed WBC 2.7.  Neutropenia can be noted in the patient's labs dating back to October 2016. -Complete neutropenia work-up on patient.  Hepatitis B, hepatitis C, HIV were negative.  ANA was negative.  Vitamin B12 was normal.  Flow cytometry was negative for any monoclonal B-cell population. -Neutropenia is most likely benign ethnic etiology. -Labs done on 03/19/2020 showed WBC 2.9, hemoglobin 13.0, platelets 176 -Recommended continued to monitor.    Orders Placed This Encounter  Procedures  . CBC with Differential/Platelet    Standing Status:   Future    Standing Expiration Date:   03/24/2021  . Comprehensive metabolic panel    Standing Status:   Future    Standing Expiration Date:   03/24/2021  . Ferritin    Standing Status:   Future    Standing Expiration Date:   03/24/2021  . Iron and TIBC    Standing Status:   Future    Standing Expiration Date:   03/24/2021  . Lactate dehydrogenase    Standing Status:   Future    Standing Expiration Date:   03/24/2021  . Vitamin B12    Standing Status:   Future    Standing Expiration Date:   03/24/2021  . VITAMIN D 25 Hydroxy (Vit-D Deficiency, Fractures)    Standing Status:   Future    Standing Expiration Date:   03/24/2021    All questions were answered. The patient knows to call the clinic with any problems, questions or concerns. We can certainly see the patient much  sooner if necessary. This note was electronically signed.  I provided 29 minutes of non face-to-face telephone visit time during this encounter, and > 50% was spent counseling as documented under my assessment & plan.   Doree Barthel, NP-C 03/24/2020

## 2020-03-27 IMAGING — US US ABDOMEN LIMITED
1 series · 12 of 12 positions shown · non-contrast
Comparison: None.

CLINICAL DATA: Assess for umbilical hernia in the abdominal wall.

EXAM:
ULTRASOUND ABDOMEN LIMITED

[Series 1: us abdomen limited · 0.09mm/px · 12 acquisitions, 12 frames shown]
[im 1/12]
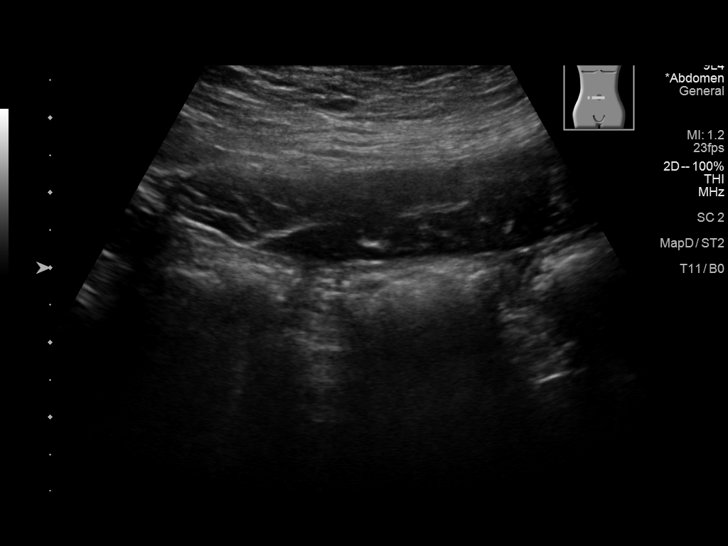
[im 2/12]
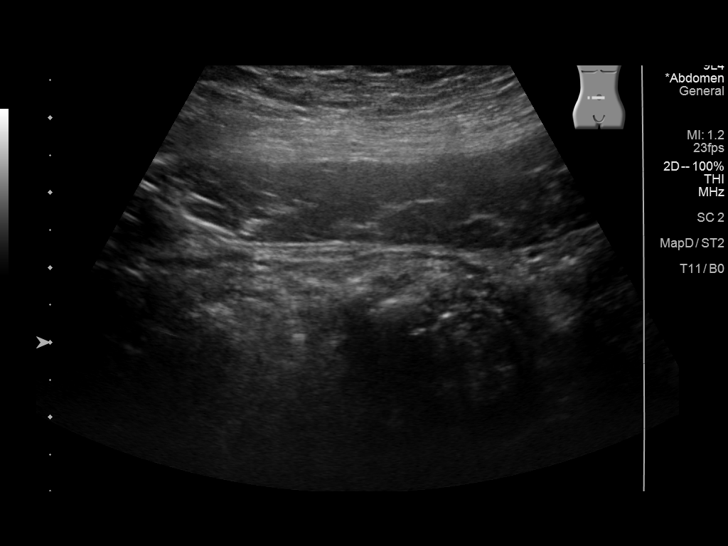
[im 3/12]
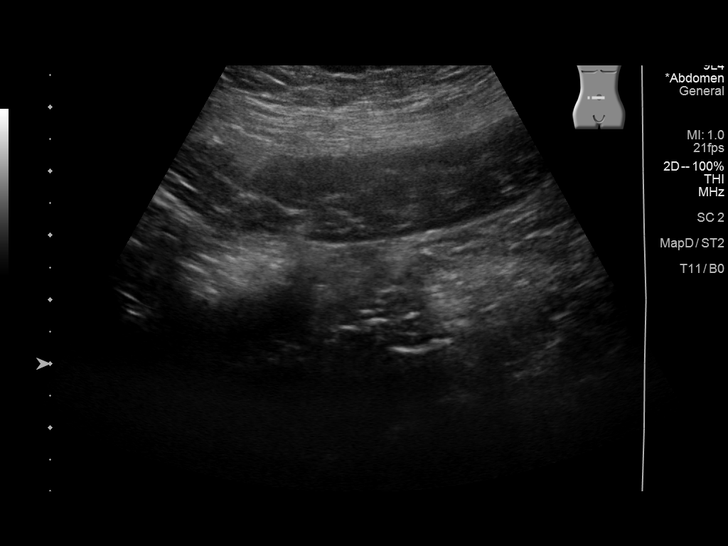
[im 4/12]
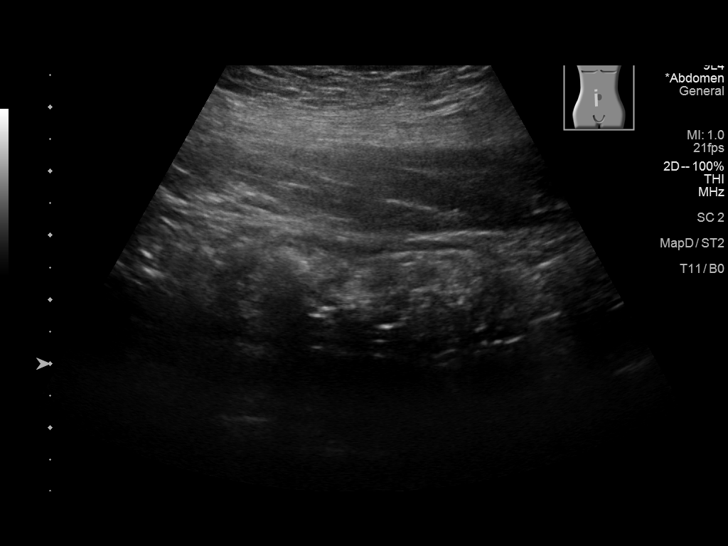
[im 5/12]
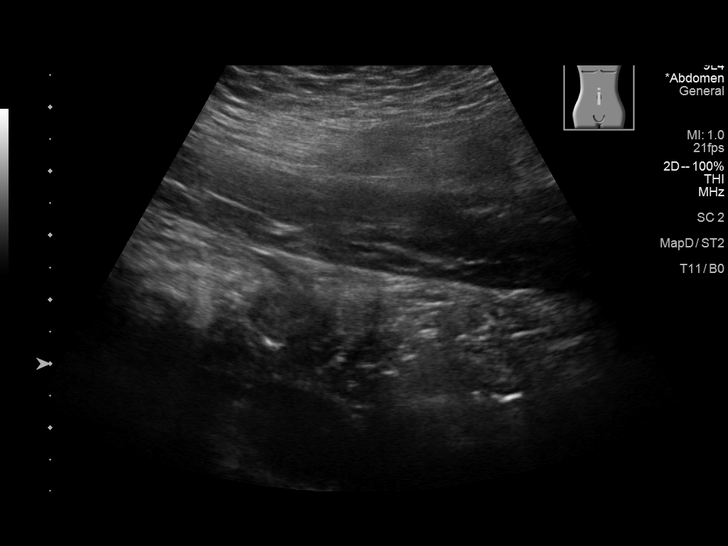
[im 6/12]
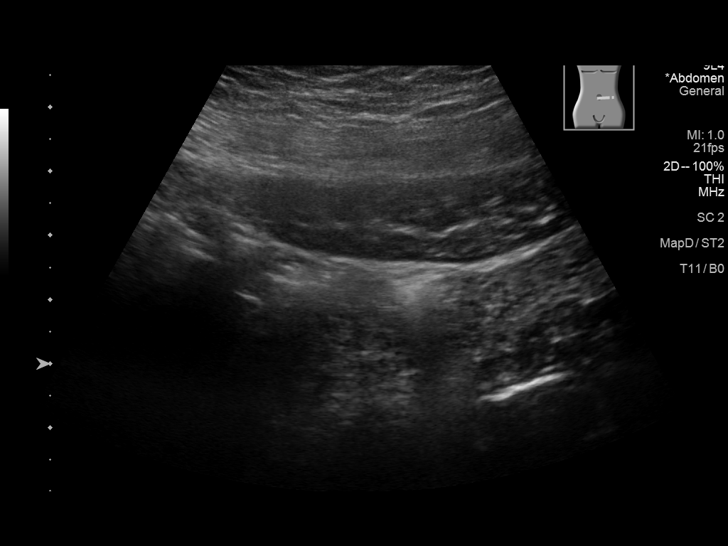
[im 7/12]
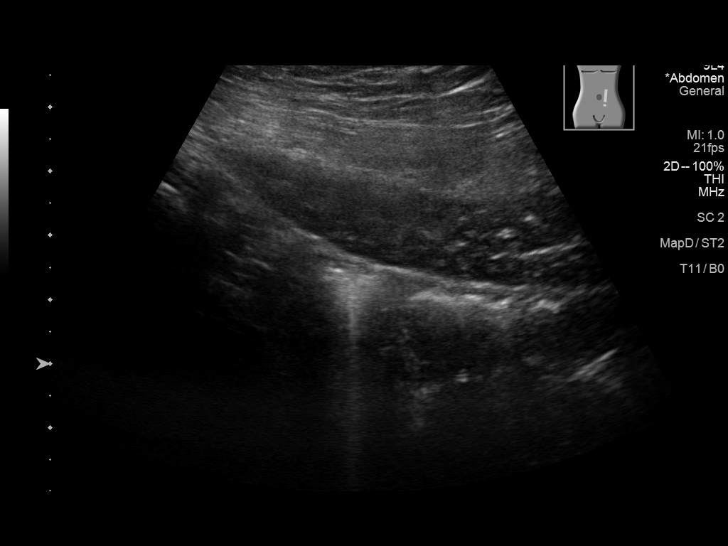
[im 8/12]
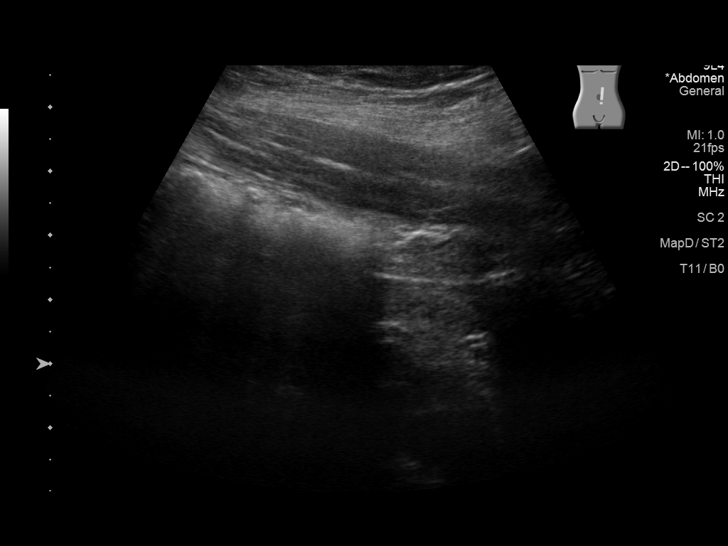
[im 9/12]
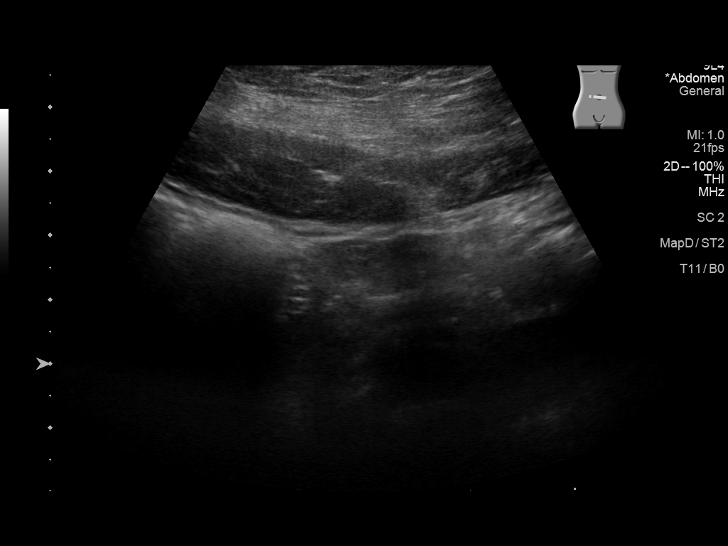
[im 10/12]
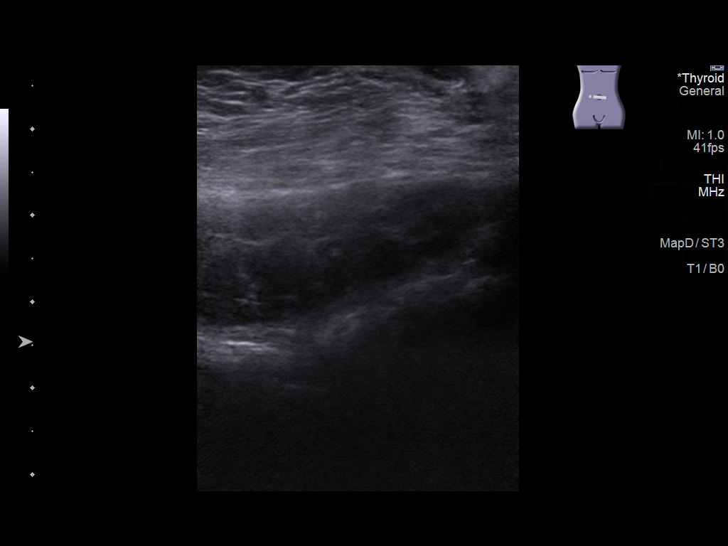
[im 11/12]
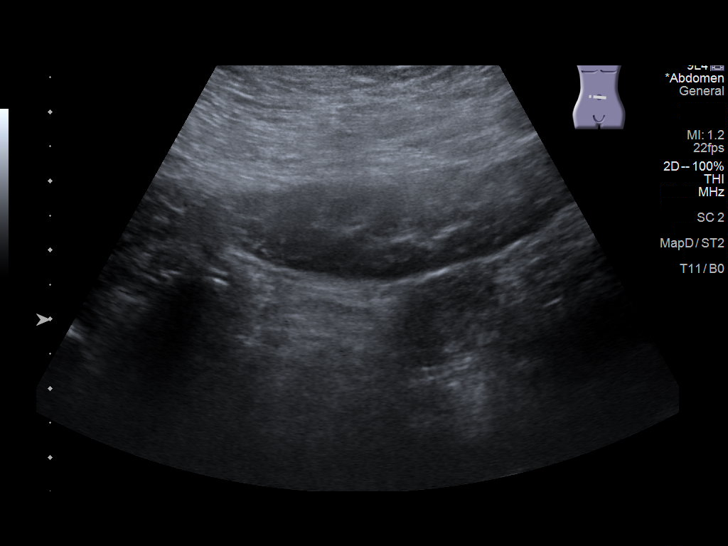
[im 12/12]
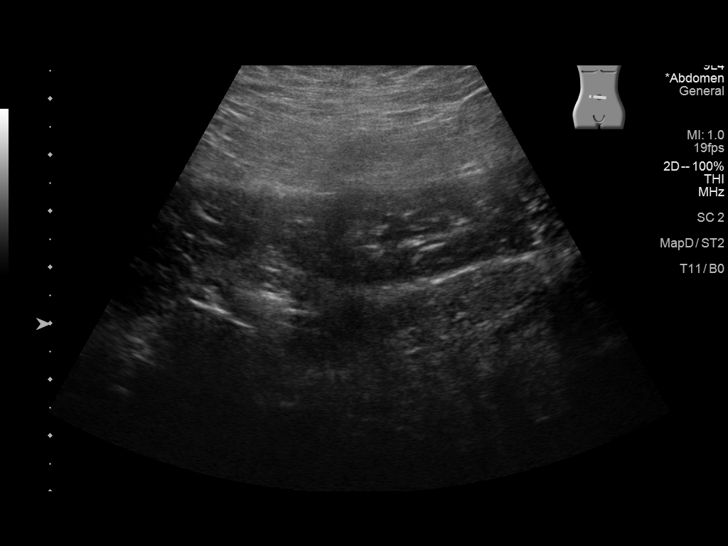

[12 of 12 positions shown; findings below may reference images not displayed]

FINDINGS: No hernia is identified in the umbilical region.
IMPRESSION: No hernia identified in the umbilical region.

## 2020-03-27 IMAGING — CR DG CHEST 2V
2 series · 2 of 2 positions shown · non-contrast
Comparison: None.

CLINICAL DATA: Preoperative evaluation for skin/subcutaneous tissue
removal

EXAM:
CHEST - 2 VIEW

[w chest pa]
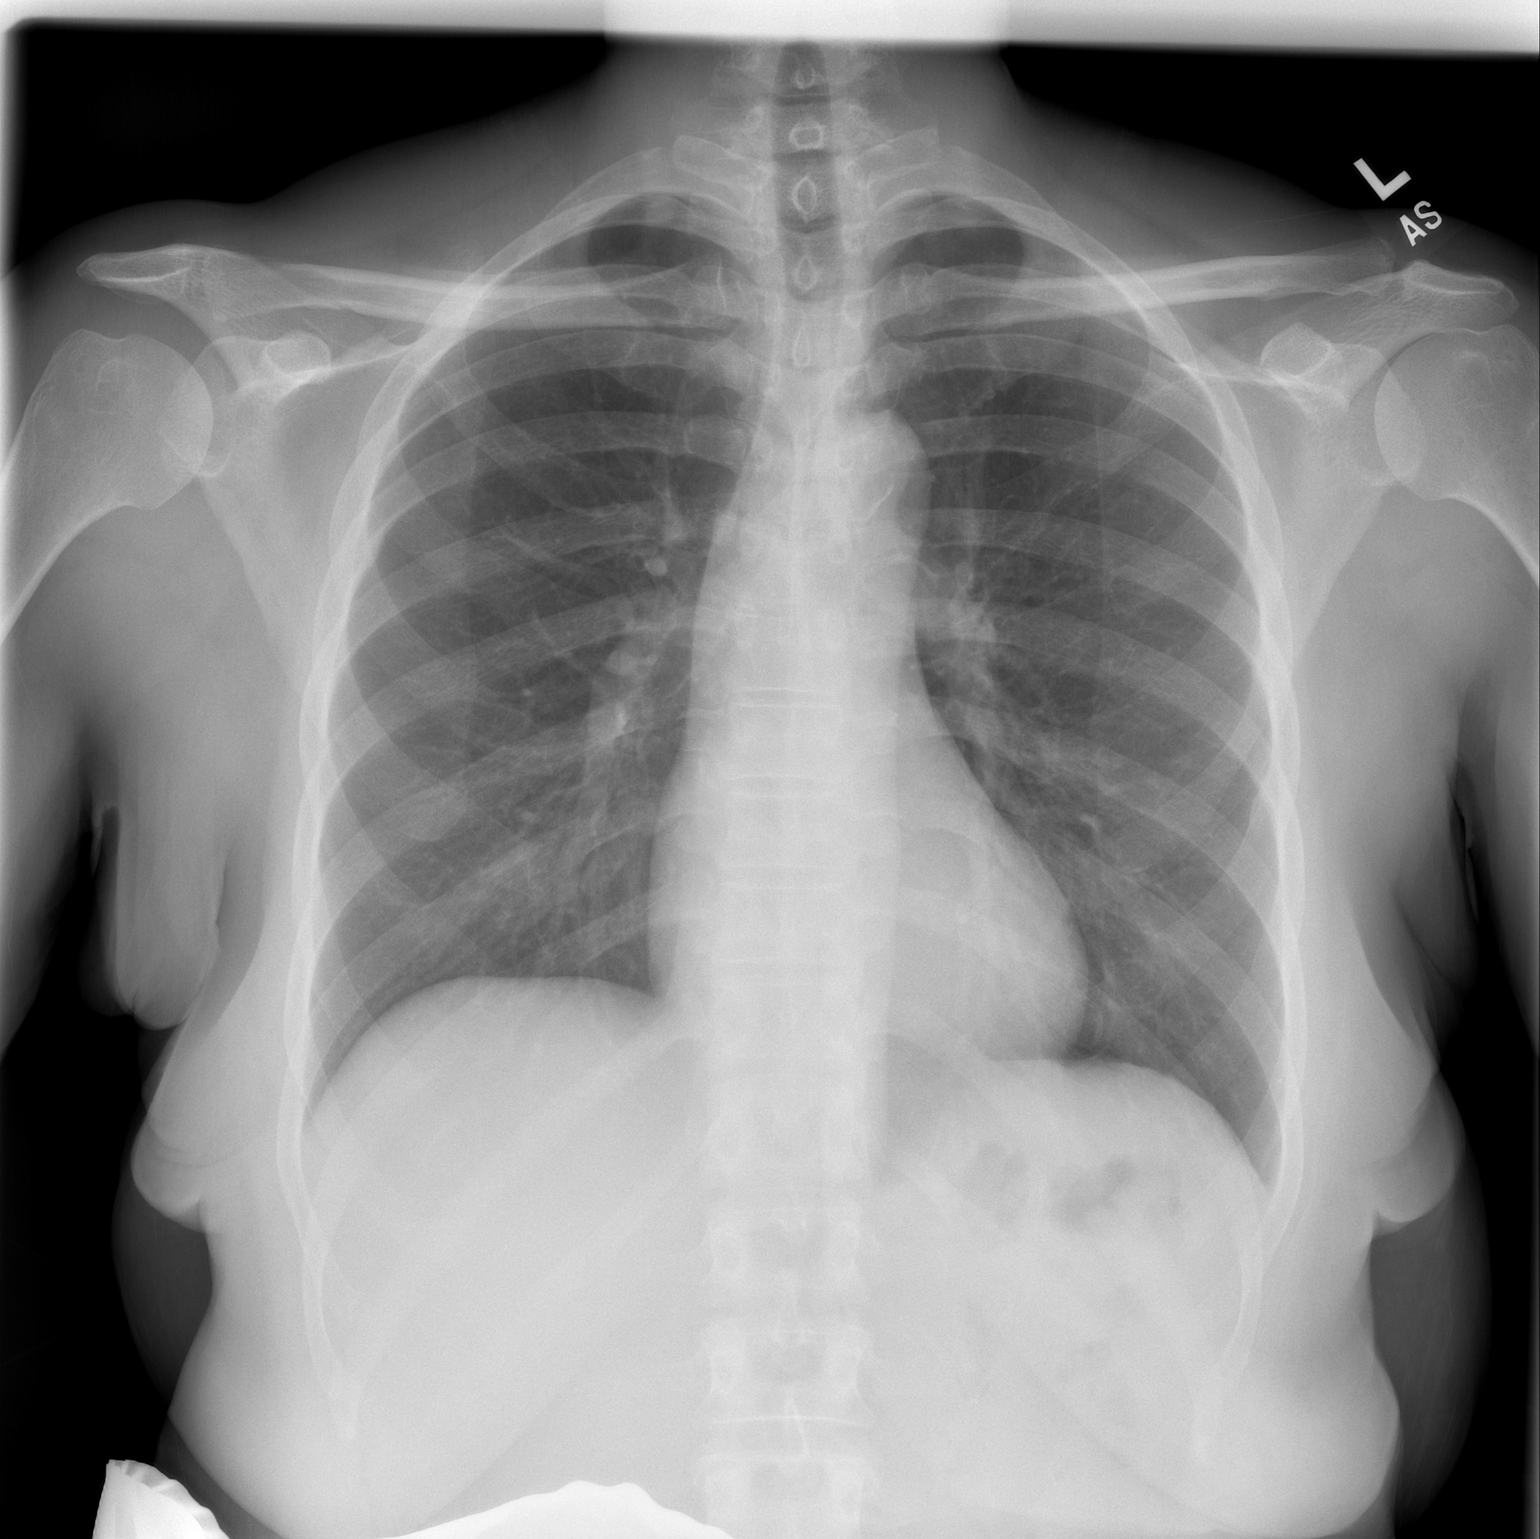

[w chest lat]
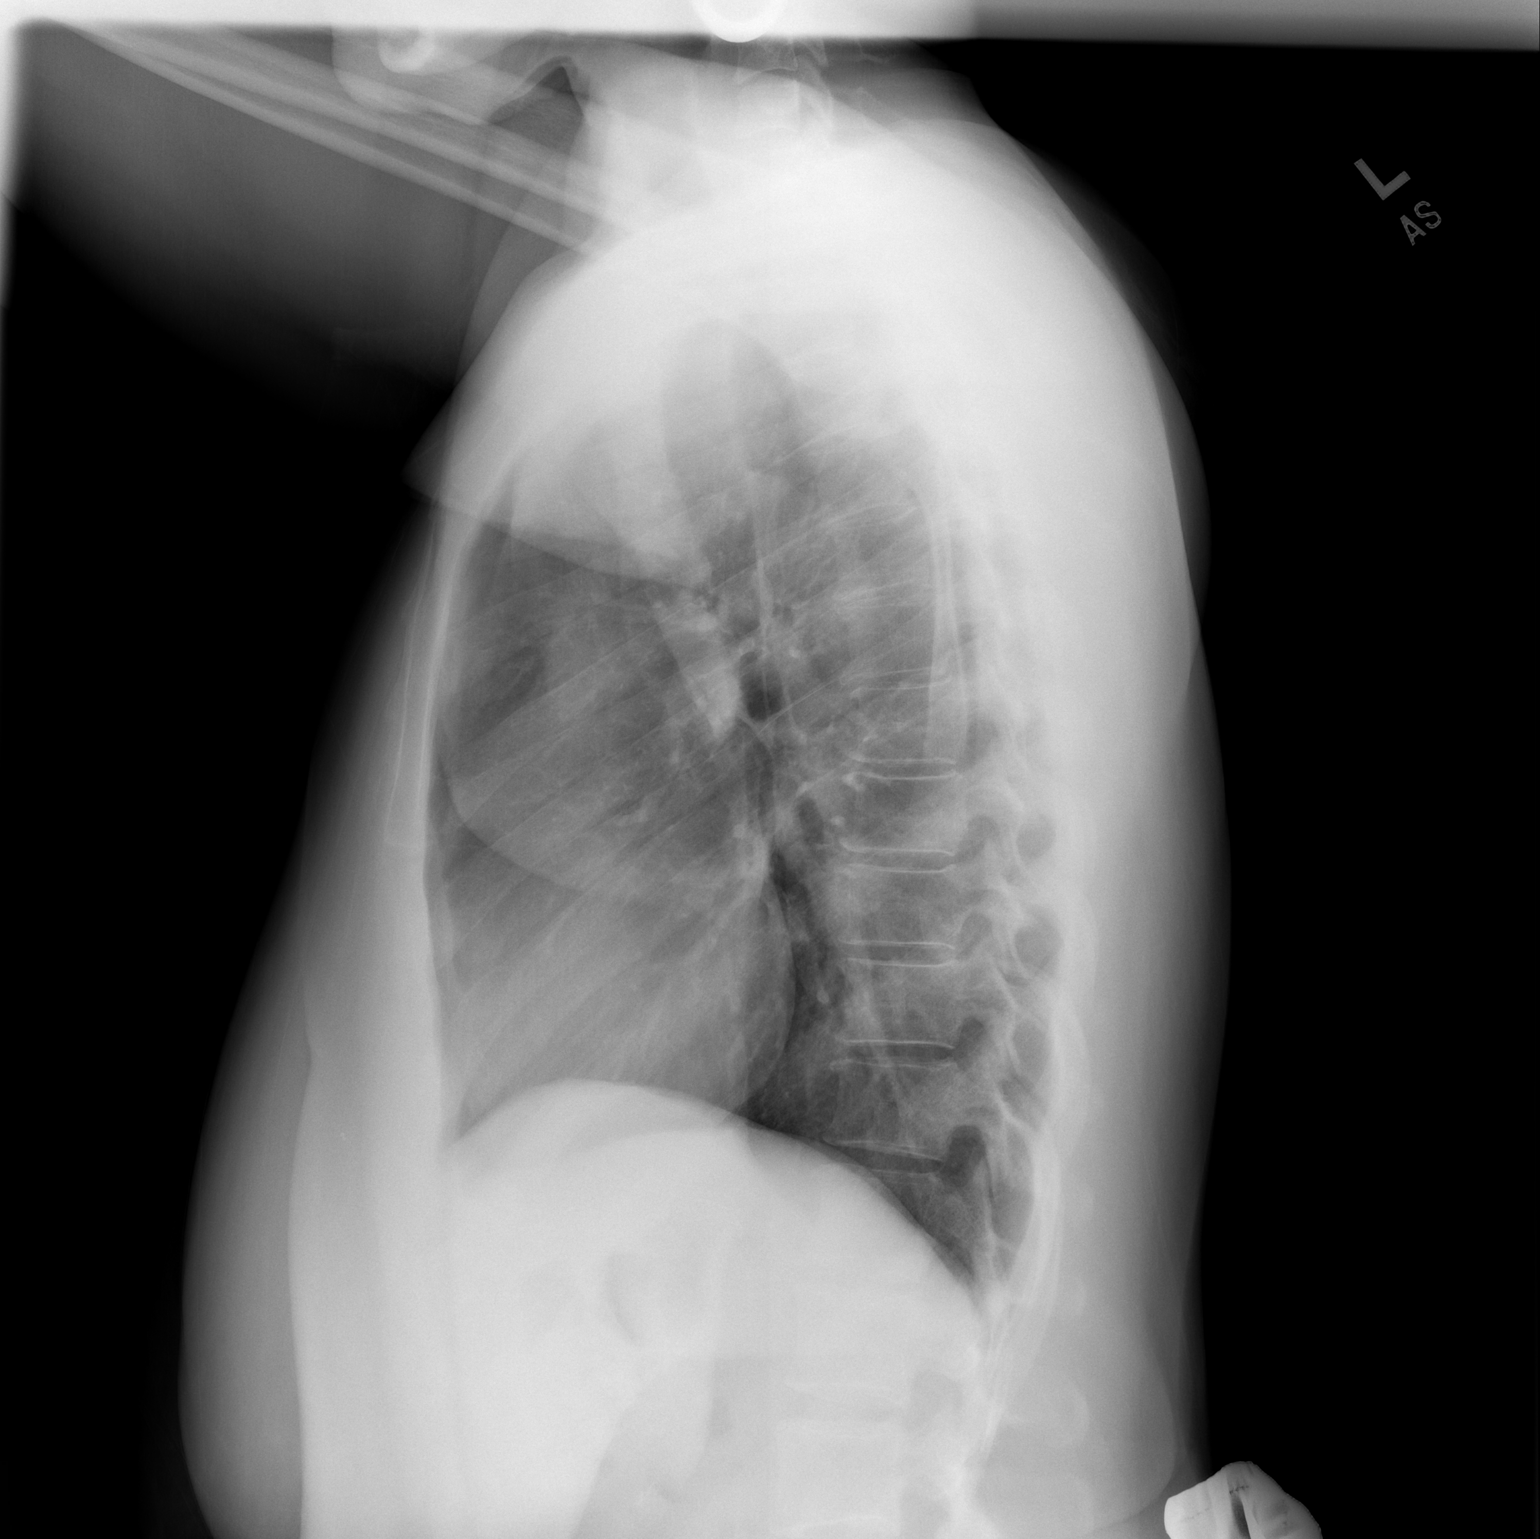

[2 of 2 positions shown; findings below may reference images not displayed]

FINDINGS: Lungs are clear. Heart size and pulmonary vascularity are normal. No
adenopathy. There is no evident bone lesion.
IMPRESSION: No edema or consolidation.

## 2020-03-31 ENCOUNTER — Other Ambulatory Visit: Payer: Self-pay

## 2020-03-31 ENCOUNTER — Inpatient Hospital Stay (HOSPITAL_COMMUNITY): Payer: BC Managed Care – PPO | Attending: Hematology

## 2020-03-31 VITALS — BP 97/56 | HR 71 | Temp 97.1°F | Resp 18

## 2020-03-31 DIAGNOSIS — D508 Other iron deficiency anemias: Secondary | ICD-10-CM | POA: Insufficient documentation

## 2020-03-31 DIAGNOSIS — K909 Intestinal malabsorption, unspecified: Secondary | ICD-10-CM | POA: Insufficient documentation

## 2020-03-31 DIAGNOSIS — D500 Iron deficiency anemia secondary to blood loss (chronic): Secondary | ICD-10-CM

## 2020-03-31 MED ORDER — SODIUM CHLORIDE 0.9 % IV SOLN: Freq: Once | INTRAVENOUS | Status: AC

## 2020-03-31 MED ORDER — SODIUM CHLORIDE 0.9 % IV SOLN
510.0000 mg | Freq: Once | INTRAVENOUS | Status: AC
  Administered 2020-03-31: 510 mg via INTRAVENOUS
  Filled 2020-03-31: qty 510

## 2020-03-31 NOTE — Patient Instructions (Signed)
[facility] at [facility]  Discharge Instructions:  IV Feraheme infusion received today. Follow up for your second dose next week as previously scheduled.  Ferumoxytol injection What is this medicine? FERUMOXYTOL is an iron complex. Iron is used to make healthy red blood cells, which carry oxygen and nutrients throughout the body. This medicine is used to treat iron deficiency anemia. This medicine may be used for other purposes; ask your health care provider or pharmacist if you have questions. COMMON BRAND NAME(S): Feraheme What should I tell my health care provider before I take this medicine? They need to know if you have any of these conditions:  anemia not caused by low iron levels  high levels of iron in the blood  magnetic resonance imaging (MRI) test scheduled  an unusual or allergic reaction to iron, other medicines, foods, dyes, or preservatives  pregnant or trying to get pregnant  breast-feeding How should I use this medicine? This medicine is for injection into a vein. It is given by a health care professional in a hospital or clinic setting. Talk to your pediatrician regarding the use of this medicine in children. Special care may be needed. Overdosage: If you think you have taken too much of this medicine contact a poison control center or emergency room at once. NOTE: This medicine is only for you. Do not share this medicine with others. What if I miss a dose? It is important not to miss your dose. Call your doctor or health care professional if you are unable to keep an appointment. What may interact with this medicine? This medicine may interact with the following medications:  other iron products This list may not describe all possible interactions. Give your health care provider a list of all the medicines, herbs, non-prescription drugs, or dietary supplements you use. Also tell them if you smoke, drink alcohol, or use illegal drugs. Some  items may interact with your medicine. What should I watch for while using this medicine? Visit your doctor or healthcare professional regularly. Tell your doctor or healthcare professional if your symptoms do not start to get better or if they get worse. You may need blood work done while you are taking this medicine. You may need to follow a special diet. Talk to your doctor. Foods that contain iron include: whole grains/cereals, dried fruits, beans, or peas, leafy green vegetables, and organ meats (liver, kidney). What side effects may I notice from receiving this medicine? Side effects that you should report to your doctor or health care professional as soon as possible:  allergic reactions like skin rash, itching or hives, swelling of the face, lips, or tongue  breathing problems  changes in blood pressure  feeling faint or lightheaded, falls  fever or chills  flushing, sweating, or hot feelings  swelling of the ankles or feet Side effects that usually do not require medical attention (report to your doctor or health care professional if they continue or are bothersome):  diarrhea  headache  nausea, vomiting  stomach pain This list may not describe all possible side effects. Call your doctor for medical advice about side effects. You may report side effects to FDA at 1-800-FDA-1088. Where should I keep my medicine? This drug is given in a hospital or clinic and will not be stored at home. NOTE: This sheet is a summary. It may not cover all possible information. If you have questions about this medicine, talk to your doctor, pharmacist, or health care provider.  2020 Elsevier/Gold Standard (2016-08-29 20:21:10)  _______________________________________________________________  Thank you for choosing [facility] at [facility] to provide your oncology and hematology care.  To afford each patient quality time with our providers, please arrive at least 15  minutes before your scheduled appointment.  You need to re-schedule your appointment if you arrive 10 or more minutes late.  We strive to give you quality time with our providers, and arriving late affects you and other patients whose appointments are after yours.  Also, if you no show three or more times for appointments you may be dismissed from the clinic.  Again, thank you for choosing [facility] at [facility]. Our hope is that these requests will allow you access to exceptional care and in a timely manner. _______________________________________________________________  If you have questions after your visit, please contact our office at [phone] between the hours of 8:30 a.m. and 5:00 p.m. Voicemails left after 4:30 p.m. will not be returned until the following business day. _______________________________________________________________  For prescription refill requests, have your pharmacy contact our office. _______________________________________________________________  Recommendations made by the consultant and any test results will be sent to your referring physician. _______________________________________________________________

## 2020-03-31 NOTE — Progress Notes (Signed)
Candice Ross presents today for IV iron infusion. Infusion tolerated without incident or complaint. See MAR for details. VSS prior to and post infusion. Discharged in satisfactory condition with follow up instructions.

## 2020-04-01 MED ORDER — FULVESTRANT 250 MG/5ML IM SOLN
INTRAMUSCULAR | Status: AC
  Filled 2020-04-01: qty 10

## 2020-04-07 ENCOUNTER — Encounter (HOSPITAL_COMMUNITY): Payer: Self-pay

## 2020-04-07 ENCOUNTER — Other Ambulatory Visit: Payer: Self-pay

## 2020-04-07 ENCOUNTER — Inpatient Hospital Stay (HOSPITAL_COMMUNITY): Payer: BC Managed Care – PPO

## 2020-04-07 VITALS — BP 128/75 | HR 57 | Temp 96.9°F | Resp 18

## 2020-04-07 DIAGNOSIS — D500 Iron deficiency anemia secondary to blood loss (chronic): Secondary | ICD-10-CM

## 2020-04-07 DIAGNOSIS — K909 Intestinal malabsorption, unspecified: Secondary | ICD-10-CM | POA: Diagnosis not present

## 2020-04-07 DIAGNOSIS — D508 Other iron deficiency anemias: Secondary | ICD-10-CM | POA: Diagnosis not present

## 2020-04-07 MED ORDER — SODIUM CHLORIDE 0.9 % IV SOLN
510.0000 mg | Freq: Once | INTRAVENOUS | Status: AC
  Administered 2020-04-07: 510 mg via INTRAVENOUS
  Filled 2020-04-07: qty 510

## 2020-04-07 MED ORDER — SODIUM CHLORIDE 0.9 % IV SOLN: Freq: Once | INTRAVENOUS | Status: AC

## 2020-04-07 NOTE — Patient Instructions (Signed)
[facility] at [facility]  Discharge Instructions:   _______________________________________________________________  Thank you for choosing [facility] at [facility] to provide your oncology and hematology care.  To afford each patient quality time with our providers, please arrive at least 15 minutes before your scheduled appointment.  You need to re-schedule your appointment if you arrive 10 or more minutes late.  We strive to give you quality time with our providers, and arriving late affects you and other patients whose appointments are after yours.  Also, if you no show three or more times for appointments you may be dismissed from the clinic.  Again, thank you for choosing [facility] at [facility]. Our hope is that these requests will allow you access to exceptional care and in a timely manner. _______________________________________________________________  If you have questions after your visit, please contact our office at [phone] between the hours of 8:30 a.m. and 5:00 p.m. Voicemails left after 4:30 p.m. will not be returned until the following business day. _______________________________________________________________  For prescription refill requests, have your pharmacy contact our office. _______________________________________________________________  Recommendations made by the consultant and any test results will be sent to your referring physician. _______________________________________________________________ 

## 2020-04-07 NOTE — Progress Notes (Signed)
[name] [place] tolerated iron infusion well today.  discharged ambulatory. Vital signs stable prior to discharge.

## 2020-04-22 DIAGNOSIS — Z6829 Body mass index (BMI) 29.0-29.9, adult: Secondary | ICD-10-CM | POA: Diagnosis not present

## 2020-04-22 DIAGNOSIS — Z1231 Encounter for screening mammogram for malignant neoplasm of breast: Secondary | ICD-10-CM | POA: Diagnosis not present

## 2020-04-22 DIAGNOSIS — Z1151 Encounter for screening for human papillomavirus (HPV): Secondary | ICD-10-CM | POA: Diagnosis not present

## 2020-04-22 DIAGNOSIS — Z01419 Encounter for gynecological examination (general) (routine) without abnormal findings: Secondary | ICD-10-CM | POA: Diagnosis not present

## 2020-04-22 DIAGNOSIS — Z124 Encounter for screening for malignant neoplasm of cervix: Secondary | ICD-10-CM | POA: Diagnosis not present

## 2020-05-12 DIAGNOSIS — Z0000 Encounter for general adult medical examination without abnormal findings: Secondary | ICD-10-CM | POA: Diagnosis not present

## 2020-05-12 DIAGNOSIS — Z130 Encounter for screening for diseases of the blood and blood-forming organs and certain disorders involving the immune mechanism: Secondary | ICD-10-CM | POA: Diagnosis not present

## 2020-05-12 DIAGNOSIS — Z131 Encounter for screening for diabetes mellitus: Secondary | ICD-10-CM | POA: Diagnosis not present

## 2020-05-12 DIAGNOSIS — Z13220 Encounter for screening for lipoid disorders: Secondary | ICD-10-CM | POA: Diagnosis not present

## 2020-05-12 DIAGNOSIS — Z1329 Encounter for screening for other suspected endocrine disorder: Secondary | ICD-10-CM | POA: Diagnosis not present

## 2020-08-17 ENCOUNTER — Other Ambulatory Visit (HOSPITAL_COMMUNITY): Payer: Self-pay | Admitting: Surgery

## 2020-08-17 DIAGNOSIS — D500 Iron deficiency anemia secondary to blood loss (chronic): Secondary | ICD-10-CM

## 2020-08-17 DIAGNOSIS — D72819 Decreased white blood cell count, unspecified: Secondary | ICD-10-CM

## 2020-08-18 ENCOUNTER — Inpatient Hospital Stay (HOSPITAL_COMMUNITY): Payer: No Typology Code available for payment source | Attending: Hematology

## 2020-08-20 ENCOUNTER — Other Ambulatory Visit: Payer: Self-pay

## 2020-08-20 ENCOUNTER — Inpatient Hospital Stay (HOSPITAL_COMMUNITY): Payer: No Typology Code available for payment source | Admitting: Oncology

## 2020-08-25 ENCOUNTER — Telehealth (HOSPITAL_COMMUNITY): Payer: BC Managed Care – PPO | Admitting: Nurse Practitioner

## 2020-12-17 ENCOUNTER — Inpatient Hospital Stay (HOSPITAL_COMMUNITY): Payer: No Typology Code available for payment source | Attending: Hematology

## 2020-12-25 ENCOUNTER — Telehealth (HOSPITAL_COMMUNITY): Payer: No Typology Code available for payment source | Admitting: Physician Assistant

## 2021-06-04 ENCOUNTER — Encounter: Payer: Self-pay | Admitting: Internal Medicine

## 2021-07-21 ENCOUNTER — Ambulatory Visit: Payer: No Typology Code available for payment source | Admitting: Internal Medicine

## 2022-07-06 ENCOUNTER — Other Ambulatory Visit: Payer: Self-pay | Admitting: *Deleted

## 2022-07-06 ENCOUNTER — Inpatient Hospital Stay: Payer: No Typology Code available for payment source | Attending: Hematology

## 2022-07-06 DIAGNOSIS — K909 Intestinal malabsorption, unspecified: Secondary | ICD-10-CM | POA: Insufficient documentation

## 2022-07-06 DIAGNOSIS — D509 Iron deficiency anemia, unspecified: Secondary | ICD-10-CM | POA: Diagnosis present

## 2022-07-06 DIAGNOSIS — F5089 Other specified eating disorder: Secondary | ICD-10-CM | POA: Diagnosis not present

## 2022-07-06 DIAGNOSIS — D72819 Decreased white blood cell count, unspecified: Secondary | ICD-10-CM

## 2022-07-06 DIAGNOSIS — R42 Dizziness and giddiness: Secondary | ICD-10-CM | POA: Diagnosis not present

## 2022-07-06 DIAGNOSIS — D500 Iron deficiency anemia secondary to blood loss (chronic): Secondary | ICD-10-CM

## 2022-07-06 DIAGNOSIS — R5383 Other fatigue: Secondary | ICD-10-CM | POA: Insufficient documentation

## 2022-07-06 DIAGNOSIS — E538 Deficiency of other specified B group vitamins: Secondary | ICD-10-CM | POA: Insufficient documentation

## 2022-07-06 DIAGNOSIS — Z9884 Bariatric surgery status: Secondary | ICD-10-CM | POA: Diagnosis not present

## 2022-07-06 LAB — IRON AND TIBC
Iron: 109 ug/dL (ref 28–170)
Saturation Ratios: 30 % (ref 10.4–31.8)
TIBC: 369 ug/dL (ref 250–450)
UIBC: 260 ug/dL

## 2022-07-06 LAB — COMPREHENSIVE METABOLIC PANEL
ALT: 12 U/L (ref 0–44)
AST: 18 U/L (ref 15–41)
Albumin: 4 g/dL (ref 3.5–5.0)
Alkaline Phosphatase: 46 U/L (ref 38–126)
Anion gap: 8 (ref 5–15)
BUN: 14 mg/dL (ref 6–20)
CO2: 24 mmol/L (ref 22–32)
Calcium: 8.9 mg/dL (ref 8.9–10.3)
Chloride: 105 mmol/L (ref 98–111)
Creatinine, Ser: 0.73 mg/dL (ref 0.44–1.00)
GFR, Estimated: >60 mL/min (ref >60)
Glucose, Bld: 80 mg/dL (ref 70–99)
Potassium: 4 mmol/L (ref 3.5–5.1)
Sodium: 137 mmol/L (ref 135–145)
Total Bilirubin: 0.8 mg/dL (ref 0.3–1.2)
Total Protein: 7.8 g/dL (ref 6.5–8.1)

## 2022-07-06 LAB — CBC WITH DIFFERENTIAL/PLATELET
Abs Immature Granulocytes: 0.01 10*3/uL (ref 0.00–0.07)
Basophils Absolute: 0 10*3/uL (ref 0.0–0.1)
Basophils Relative: 0 %
Eosinophils Absolute: 0 10*3/uL (ref 0.0–0.5)
Eosinophils Relative: 0 %
HCT: 43.2 % (ref 36.0–46.0)
Hemoglobin: 14 g/dL (ref 12.0–15.0)
Immature Granulocytes: 0 %
Lymphocytes Relative: 24 %
Lymphs Abs: 1.1 10*3/uL (ref 0.7–4.0)
MCH: 30.1 pg (ref 26.0–34.0)
MCHC: 32.4 g/dL (ref 30.0–36.0)
MCV: 92.9 fL (ref 80.0–100.0)
Monocytes Absolute: 0.3 10*3/uL (ref 0.1–1.0)
Monocytes Relative: 7 %
Neutro Abs: 3.1 10*3/uL (ref 1.7–7.7)
Neutrophils Relative %: 69 %
Platelets: 178 10*3/uL (ref 150–400)
RBC: 4.65 MIL/uL (ref 3.87–5.11)
RDW: 13.5 % (ref 11.5–15.5)
WBC: 4.6 10*3/uL (ref 4.0–10.5)
nRBC: 0 % (ref 0.0–0.2)

## 2022-07-06 LAB — VITAMIN D 25 HYDROXY (VIT D DEFICIENCY, FRACTURES): Vit D, 25-Hydroxy: 24.12 ng/mL — ABNORMAL LOW (ref 30–100)

## 2022-07-06 LAB — FERRITIN: Ferritin: 28 ng/mL (ref 11–307)

## 2022-07-06 LAB — VITAMIN B12: Vitamin B-12: 198 pg/mL (ref 180–914)

## 2022-07-11 LAB — METHYLMALONIC ACID, SERUM: Methylmalonic Acid, Quantitative: 134 nmol/L (ref 0–378)

## 2022-07-11 NOTE — Progress Notes (Unsigned)
Candice Ross, [place] [id number]   CLINIC:  Medical Oncology/Hematology  PCP:  Patient, No Pcp Per No address on file None   REASON FOR VISIT:  Follow-up for iron deficiency anemia  CURRENT THERAPY: Intermittent IV iron infusions  INTERVAL HISTORY:  Candice Ross 51 y.o. female returns for routine follow-up of her iron deficiency anemia.  She was last seen in August 2021 by NP Wenda Low, was lost to follow-up after that time.  She returns today to reestablish care.  At today's visit, she reports feeling fair.  No recent hospitalizations, surgeries, or changes in baseline health status.  She has not had any iron, vitamin D, or vitamin B12 supplementation since her last visit 2 years ago.  She reports that oral supplementation "makes her stomach hurt."  She denies any rectal bleeding or melena.  She does have some vaginal spotting for the past 2 weeks, but reports that her last "real" menstrual period was about a year ago.  She reports significant fatigue and has abnormal cravings for salty food.  She has lightheadedness.  No pagophagia, restless legs, headaches, chest pain, dyspnea on exertion, or syncope.  No B symptoms or frequent infections.  She has little to no energy and 70% appetite. She endorses that she is maintaining a stable weight.   REVIEW OF SYSTEMS: Review of Systems  Constitutional:  Positive for fatigue. Negative for appetite change, chills, diaphoresis, fever and unexpected weight change.  HENT:   Negative for lump/mass and nosebleeds.   Eyes:  Negative for eye problems.  Respiratory:  Negative for cough, hemoptysis and shortness of breath.   Cardiovascular:  Negative for chest pain, leg swelling and palpitations.  Gastrointestinal:  Negative for abdominal pain, blood in stool, constipation, diarrhea, nausea and vomiting.  Genitourinary:  Negative for hematuria.   Skin: Negative.   Neurological:  Positive for light-headedness.  Negative for dizziness and headaches.  Hematological:  Does not bruise/bleed easily.      PAST MEDICAL/SURGICAL HISTORY:  Past Medical History:  Diagnosis Date   Anemia    Iron deficiency anemia due to chronic blood loss 12/31/2013   Ulcerative colitis ([place])    POSSIBLE   Past Surgical History:  Procedure Laterality Date   COLONOSCOPY N/A 01/13/2014   Procedure: COLONOSCOPY;  Surgeon: Danie Binder, MD;  [facility];  Service: Endoscopy;  Laterality: N/A;  10:30   ESOPHAGOGASTRODUODENOSCOPY N/A 01/13/2014   Procedure: ESOPHAGOGASTRODUODENOSCOPY (EGD);  Surgeon: Danie Binder, MD;  Location: [facility];  Service: Endoscopy;  Laterality: N/A;   LAPAROSCOPIC GASTRIC BYPASS     [place]     SOCIAL HISTORY:  Social History   Socioeconomic History   Marital status: Divorced    Spouse name: Not on file   Number of children: Not on file   Years of education: Not on file   Highest education level: Not on file  Occupational History   Occupation: Technical brewer   Occupation: Therapist, sports for [employer]   Smoking status: Never   Smokeless tobacco: Never  Substance and Sexual Activity   Alcohol use: No   Drug use: No   Sexual activity: Yes    Birth control/protection: I.U.D.  Other Topics Concern   Not on file  Social History Narrative   Not on file   Social Determinants of Health   Financial Resource Strain: Not on file  Food Insecurity: Not on file  Transportation Needs: Not on file  Physical Activity: Not on file  Stress: Not on file  Social Connections: Not on file  Intimate Partner Violence: Not on file    FAMILY HISTORY:  Family History  Problem Relation Age of Onset   Colon cancer Neg Hx     CURRENT MEDICATIONS:  No outpatient encounter medications on file as of 07/12/2022.   Facility-Administered Encounter Medications as of 07/12/2022  Medication   0.9 %  sodium chloride infusion    ALLERGIES:  Allergies  Allergen  Reactions   Morphine And Related      PHYSICAL EXAM:  ECOG PERFORMANCE STATUS: 1 - Symptomatic but completely ambulatory  There were no vitals filed for this visit. There were no vitals filed for this visit. Physical Exam Constitutional:      Appearance: Normal appearance.  HENT:     Head: Normocephalic and atraumatic.     Mouth/Throat:     Mouth: Mucous membranes are moist.  Eyes:     Extraocular Movements: Extraocular movements intact.     Pupils: Pupils are equal, round, and reactive to light.  Cardiovascular:     Rate and Rhythm: Normal rate and regular rhythm.     Pulses: Normal pulses.     Heart sounds: Normal heart sounds.  Pulmonary:     Effort: Pulmonary effort is normal.     Breath sounds: Normal breath sounds.  Abdominal:     General: Bowel sounds are normal.     Palpations: Abdomen is soft.     Tenderness: There is no abdominal tenderness.  Musculoskeletal:        General: No swelling.     Right lower leg: No edema.     Left lower leg: No edema.  Lymphadenopathy:     Cervical: No cervical adenopathy.  Skin:    General: Skin is warm and dry.  Neurological:     General: No focal deficit present.     Mental Status: She is alert and oriented to person, place, and time.  Psychiatric:        Mood and Affect: Mood normal.        Behavior: Behavior normal.      LABORATORY DATA:  I have reviewed the labs as listed.  CBC    Component Value Date/Time   WBC 4.6 07/06/2022 1032   RBC 4.65 07/06/2022 1032   HGB 14.0 07/06/2022 1032   HGB 8.6 (L) 03/07/2013 1546   HCT 43.2 07/06/2022 1032   HCT 27.9 (L) 03/07/2013 1546   PLT 178 07/06/2022 1032   PLT 299 03/07/2013 1546   MCV 92.9 07/06/2022 1032   MCV 65 (L) 03/07/2013 1546   MCH 30.1 07/06/2022 1032   MCHC 32.4 07/06/2022 1032   RDW 13.5 07/06/2022 1032   RDW 20.7 (H) 03/07/2013 1546   LYMPHSABS 1.1 07/06/2022 1032   MONOABS 0.3 07/06/2022 1032   EOSABS 0.0 07/06/2022 1032   BASOSABS 0.0  07/06/2022 1032      Latest Ref Rng & Units 07/06/2022   10:32 AM 03/19/2020    9:07 AM 08/27/2019    9:32 AM  CMP  Glucose 70 - 99 mg/dL 80  76  69   BUN 6 - 20 mg/dL 14  18  17    Creatinine 0.44 - 1.00 mg/dL 0.73  0.78  0.90   Sodium 135 - 145 mmol/L 137  137  142   Potassium 3.5 - 5.1 mmol/L 4.0  4.1  4.5   Chloride 98 - 111 mmol/L 105  107  107   CO2 22 - 32 mmol/L 24  24  29    Calcium 8.9 - 10.3 mg/dL 8.9  8.7  9.5   Total Protein 6.5 - 8.1 g/dL 7.8  7.5  8.2   Total Bilirubin 0.3 - 1.2 mg/dL 0.8  0.8  0.8   Alkaline Phos 38 - 126 U/L 46  34  38   AST 15 - 41 U/L 18  18  21    ALT 0 - 44 U/L 12  13  15      DIAGNOSTIC IMAGING:  I have independently reviewed the relevant imaging and discussed with the patient.  ASSESSMENT & PLAN: 1.  Iron deficiency anemia - Etiology is secondary to malabsorption (history of gastric bypass, chronic PPI therapy) and blood loss (history of anastomotic ulcer) - EGD (01/13/2014 by Dr. ): Small anastomotic ulcer, mild gastritis, gastric polyps - Colonoscopy (01/13/2014): Redundant colon, moderate-sized external hemorrhoids - Last IV iron was 04/07/2020 - She is not on any current supplementation, reports that it "upsets her stomach."   - No rectal bleeding or melena - Symptomatic with severe fatigue - Most recent labs (07/06/2022): Hgb 14.0/MCV 92.9 ferritin 28, iron saturation 30%.  CMP normal. - PLAN: Commend Venofer 300 mg x 3 due to symptomatic iron deficiency in the setting of malabsorption/gastric bypass surgery. - Labs in 4 months, office visit 1 week after labs  2.  Vitamin B12 deficiency - Likely secondary to malabsorption from gastric bypass surgery - Most recent labs (07/06/2022): Vitamin B12 198 with MMA normal at 134 - She is not on any current supplementation, reports that it "upsets her stomach."   - PLAN: Recommend vitamin B12 repletion with weekly B12 injections x 4 followed by monthly B12 injections  3.  Vitamin D  deficiency - Most recent labs (07/06/2022): Vitamin D mildly low at 24.12 - She is not on any current supplementation, reports that it "upsets her stomach" when she was previously on once a week vitamin D supplementation - PLAN: Recommend vitamin D replacement with 1000 mcg daily to see if that is better tolerated than the high-dose weekly vitamin D.  4.  Leukopenia - Review of past labs shows the patient has chronic leukopenia/low-normal WBCs since at least 2014, with WBC ranging from 2.9 to 5.2.  Lowest ANC was 1.3 (09/26/2017) - Complete neutropenia work-up on patient.  Hepatitis B, hepatitis C, HIV were negative.  ANA was negative.  Flow cytometry was negative for any monoclonal B-cell population. 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  (FIRST injection today) >> Venofer 300 mg x 3 >> Labs in 3 months (CBC/D, CMP, ferritin, iron/TIBC, B12, MMA, folate, vitamin D) >> OFFICE visit 1 week after labs   All questions were answered. The patient knows to call the clinic with any problems, questions or concerns.  Medical decision making: Low  Time spent on visit: I spent 15 minutes counseling the patient face to face. The total time spent in the appointment was 22 minutes and more than 50% was on counseling.   07/08/2022, PA-C  07/12/22 2:29 PM

## 2022-07-12 ENCOUNTER — Inpatient Hospital Stay: Payer: No Typology Code available for payment source

## 2022-07-12 ENCOUNTER — Inpatient Hospital Stay (HOSPITAL_BASED_OUTPATIENT_CLINIC_OR_DEPARTMENT_OTHER): Payer: No Typology Code available for payment source | Admitting: Physician Assistant

## 2022-07-12 VITALS — BP 131/78 | HR 56 | Temp 97.1°F | Resp 18 | Ht 66.0 in | Wt 178.0 lb

## 2022-07-12 DIAGNOSIS — K9089 Other intestinal malabsorption: Secondary | ICD-10-CM | POA: Diagnosis not present

## 2022-07-12 DIAGNOSIS — E538 Deficiency of other specified B group vitamins: Secondary | ICD-10-CM | POA: Diagnosis not present

## 2022-07-12 DIAGNOSIS — E559 Vitamin D deficiency, unspecified: Secondary | ICD-10-CM | POA: Diagnosis not present

## 2022-07-12 DIAGNOSIS — E611 Iron deficiency: Secondary | ICD-10-CM

## 2022-07-12 DIAGNOSIS — D500 Iron deficiency anemia secondary to blood loss (chronic): Secondary | ICD-10-CM

## 2022-07-12 DIAGNOSIS — Z9884 Bariatric surgery status: Secondary | ICD-10-CM

## 2022-07-12 DIAGNOSIS — D509 Iron deficiency anemia, unspecified: Secondary | ICD-10-CM | POA: Diagnosis not present

## 2022-07-12 MED ORDER — CYANOCOBALAMIN 1000 MCG/ML IJ SOLN
1000.0000 ug | Freq: Once | INTRAMUSCULAR | Status: AC
  Administered 2022-07-12: 1000 ug via INTRAMUSCULAR
  Filled 2022-07-12: qty 1

## 2022-07-12 MED ORDER — VITAMIN D 25 MCG (1000 UNIT) PO TABS: 1000.0000 [IU] | ORAL_TABLET | Freq: Every day | ORAL | 11 refills | Status: DC

## 2022-07-12 NOTE — Patient Instructions (Signed)
[facility]  Discharge Instructions: Thank you for choosing [facility] to provide your oncology and hematology care.  If you have a lab appointment with the Cancer Center, please come in thru the Main Entrance and check in at the main information desk.  Wear comfortable clothing and clothing appropriate for easy access to any Portacath or PICC line.   We strive to give you quality time with your provider. You may need to reschedule your appointment if you arrive late (15 or more minutes).  Arriving late affects you and other patients whose appointments are after yours.  Also, if you miss three or more appointments without notifying the office, you may be dismissed from the clinic at the provider's discretion.      For prescription refill requests, have your pharmacy contact our office and allow 72 hours for refills to be completed.    Today you received a B12 injection    To help prevent nausea and vomiting after your treatment, we encourage you to take your nausea medication as directed.  BELOW ARE SYMPTOMS THAT SHOULD BE REPORTED IMMEDIATELY: *FEVER GREATER THAN 100.4 F (38 C) OR HIGHER *CHILLS OR SWEATING *NAUSEA AND VOMITING THAT IS NOT CONTROLLED WITH YOUR NAUSEA MEDICATION *UNUSUAL SHORTNESS OF BREATH *UNUSUAL BRUISING OR BLEEDING *URINARY PROBLEMS (pain or burning when urinating, or frequent urination) *BOWEL PROBLEMS (unusual diarrhea, constipation, pain near the anus) TENDERNESS IN MOUTH AND THROAT WITH OR WITHOUT PRESENCE OF ULCERS (sore throat, sores in mouth, or a toothache) UNUSUAL RASH, SWELLING OR PAIN  UNUSUAL VAGINAL DISCHARGE OR ITCHING   Items with * indicate a potential emergency and should be followed up as soon as possible or go to the Emergency Department if any problems should occur.  Please show the CHEMOTHERAPY ALERT CARD or IMMUNOTHERAPY ALERT CARD at check-in to the Emergency Department and triage nurse.  Should you have  questions after your visit or need to cancel or reschedule your appointment, please contact [facility] [place] [phone]  and follow the prompts.  Office hours are 8:00 a.m. to 4:30 p.m. Monday - Friday. Please note that voicemails left after 4:00 p.m. may not be returned until the following business day.  We are closed weekends and major holidays. You have access to a nurse at all times for urgent questions. Please call the main number to the clinic [phone] and follow the prompts.  For any non-urgent questions, you may also contact your provider using MyChart. We now offer e-Visits for anyone 18 and older to request care online for non-urgent symptoms. For details visit [email or web address].   Also download the MyChart app! Go to the app store, search "MyChart", open the app, select [place], and log in with your MyChart username and password.  Masks are optional in the cancer centers. If you would like for your care team to wear a mask while they are taking care of you, please let them know. You may have one support person who is at least 52 years old accompany you for your appointments.  

## 2022-07-12 NOTE — Patient Instructions (Signed)
[facility] at [facility] **VISIT SUMMARY & IMPORTANT INSTRUCTIONS **   You were seen today by Rojelio Brenner PA-C for your iron deficiency and vitamin B12 deficiency.    NUTRIENT DEFICIENCIES: You have low iron, vitamin B12, and vitamin D.  This is likely caused by malabsorption following gastric bypass surgery.  We will treat your vitamin/mineral deficiencies with the following: IV iron x 3 doses. Vitamin B12 injections once a week x 4 weeks, followed by injections once a month Vitamin D 1000 mcg supplement daily (available over-the-counter)  FOLLOW-UP APPOINTMENT: We will check labs and see you for follow-up visit in about 4 months.  ** Thank you for trusting me with your healthcare!  I strive to provide all of my patients with quality care at each visit.  If you receive a survey for this visit, I would be so grateful to you for taking the time to provide feedback.  Thank you in advance!  ~ Scout Guyett                   Dr. Doreatha Massed   &   Rojelio Brenner, PA-C   - - - - - - - - - - - - - - - - - -    Thank you for choosing [facility] at [facility] to provide your oncology and hematology care.  To afford each patient quality time with our provider, please arrive at least 15 minutes before your scheduled appointment time.   If you have a lab appointment with the Cancer Center please come in thru the Main Entrance and check in at the main information desk.  You need to re-schedule your appointment should you arrive 10 or more minutes late.  We strive to give you quality time with our providers, and arriving late affects you and other patients whose appointments are after yours.  Also, if you no show three or more times for appointments you may be dismissed from the clinic at the providers discretion.     Again, thank you for choosing [facility].  Our hope is that these requests will decrease the amount of time that you  wait before being seen by our physicians.       _____________________________________________________________  Should you have questions after your visit to [facility], please contact our office at [phone] and follow the prompts.  Our office hours are 8:00 a.m. and 4:30 p.m. Monday - Friday.  Please note that voicemails left after 4:00 p.m. may not be returned until the following business day.  We are closed weekends and major holidays.  You do have access to a nurse 24-7, just call the main number to the clinic [phone] and do not press any options, hold on the line and a nurse will answer the phone.    For prescription refill requests, have your pharmacy contact our office and allow 72 hours.

## 2022-07-12 NOTE — Progress Notes (Signed)
Patient in clinic today for follow up visit. PA ordered a B 12 injection. See MAR for injection information. Patient remained stable throughout injection. Patient discharged from clinic ambulatory and in stable condition.

## 2022-07-13 ENCOUNTER — Other Ambulatory Visit: Payer: Self-pay

## 2022-07-13 DIAGNOSIS — D500 Iron deficiency anemia secondary to blood loss (chronic): Secondary | ICD-10-CM

## 2022-07-13 DIAGNOSIS — E559 Vitamin D deficiency, unspecified: Secondary | ICD-10-CM

## 2022-07-13 DIAGNOSIS — E538 Deficiency of other specified B group vitamins: Secondary | ICD-10-CM

## 2022-07-13 DIAGNOSIS — E611 Iron deficiency: Secondary | ICD-10-CM

## 2022-07-20 ENCOUNTER — Inpatient Hospital Stay: Payer: No Typology Code available for payment source

## 2022-07-20 VITALS — BP 114/63 | HR 50 | Resp 18

## 2022-07-20 DIAGNOSIS — E538 Deficiency of other specified B group vitamins: Secondary | ICD-10-CM

## 2022-07-20 DIAGNOSIS — Z9884 Bariatric surgery status: Secondary | ICD-10-CM

## 2022-07-20 DIAGNOSIS — D500 Iron deficiency anemia secondary to blood loss (chronic): Secondary | ICD-10-CM

## 2022-07-20 DIAGNOSIS — D509 Iron deficiency anemia, unspecified: Secondary | ICD-10-CM | POA: Diagnosis not present

## 2022-07-20 MED ORDER — CYANOCOBALAMIN 1000 MCG/ML IJ SOLN
1000.0000 ug | Freq: Once | INTRAMUSCULAR | Status: AC
  Administered 2022-07-20: 1000 ug via INTRAMUSCULAR
  Filled 2022-07-20: qty 1

## 2022-07-20 NOTE — Patient Instructions (Signed)
[facility] [place]  Discharge Instructions: Thank you for choosing [facility] to provide your oncology and hematology care.  If you have a lab appointment with the Cancer Center, please come in thru the Main Entrance and check in at the main information desk.  Wear comfortable clothing and clothing appropriate for easy access to any Portacath or PICC line.   We strive to give you quality time with your provider. You may need to reschedule your appointment if you arrive late (15 or more minutes).  Arriving late affects you and other patients whose appointments are after yours.  Also, if you miss three or more appointments without notifying the office, you may be dismissed from the clinic at the provider's discretion.      For prescription refill requests, have your pharmacy contact our office and allow 72 hours for refills to be completed.    Today you received the following B12 injection, return as scheduled.   To help prevent nausea and vomiting after your treatment, we encourage you to take your nausea medication as directed.  BELOW ARE SYMPTOMS THAT SHOULD BE REPORTED IMMEDIATELY: *FEVER GREATER THAN 100.4 F (38 C) OR HIGHER *CHILLS OR SWEATING *NAUSEA AND VOMITING THAT IS NOT CONTROLLED WITH YOUR NAUSEA MEDICATION *UNUSUAL SHORTNESS OF BREATH *UNUSUAL BRUISING OR BLEEDING *URINARY PROBLEMS (pain or burning when urinating, or frequent urination) *BOWEL PROBLEMS (unusual diarrhea, constipation, pain near the anus) TENDERNESS IN MOUTH AND THROAT WITH OR WITHOUT PRESENCE OF ULCERS (sore throat, sores in mouth, or a toothache) UNUSUAL RASH, SWELLING OR PAIN  UNUSUAL VAGINAL DISCHARGE OR ITCHING   Items with * indicate a potential emergency and should be followed up as soon as possible or go to the Emergency Department if any problems should occur.  Please show the CHEMOTHERAPY ALERT CARD or IMMUNOTHERAPY ALERT CARD at check-in to the Emergency Department and  triage nurse.  Should you have questions after your visit or need to cancel or reschedule your appointment, please contact [facility] [place] [facility] [phone]  and follow the prompts.  Office hours are 8:00 a.m. to 4:30 p.m. Monday - Friday. Please note that voicemails left after 4:00 p.m. may not be returned until the following business day.  We are closed weekends and major holidays. You have access to a nurse at all times for urgent questions. Please call the main number to the clinic [phone] and follow the prompts.  For any non-urgent questions, you may also contact your provider using MyChart. We now offer e-Visits for anyone 18 and older to request care online for non-urgent symptoms. For details visit [email or web address].   Also download the MyChart app! Go to the app store, search "MyChart", open the app, select [place], and log in with your MyChart username and password.   

## 2022-07-20 NOTE — Progress Notes (Signed)
Patient tolerated B12 injection with no complaints voiced. Site clean and dry with no bruising or swelling noted at site. See MAR for details. Band aid applied.  Patient stable during and after injection. VSS with discharge and left in satisfactory condition with no s/s of distress noted. 

## 2022-07-27 ENCOUNTER — Inpatient Hospital Stay: Payer: No Typology Code available for payment source | Attending: Hematology

## 2022-07-27 VITALS — BP 123/74 | HR 104 | Temp 97.0°F | Resp 18

## 2022-07-27 DIAGNOSIS — Z9884 Bariatric surgery status: Secondary | ICD-10-CM

## 2022-07-27 DIAGNOSIS — E538 Deficiency of other specified B group vitamins: Secondary | ICD-10-CM | POA: Insufficient documentation

## 2022-07-27 DIAGNOSIS — D509 Iron deficiency anemia, unspecified: Secondary | ICD-10-CM | POA: Insufficient documentation

## 2022-07-27 DIAGNOSIS — D500 Iron deficiency anemia secondary to blood loss (chronic): Secondary | ICD-10-CM

## 2022-07-27 MED ORDER — CYANOCOBALAMIN 1000 MCG/ML IJ SOLN
1000.0000 ug | Freq: Once | INTRAMUSCULAR | Status: AC
  Administered 2022-07-27: 1000 ug via INTRAMUSCULAR
  Filled 2022-07-27: qty 1

## 2022-07-27 NOTE — Progress Notes (Signed)
Patient tolerated Vitamin B 12 injection with no complaints voiced.  Site clean and dry with no bruising or swelling noted.  No complaints of pain.  Discharged with vital signs stable and no signs or symptoms of distress noted.   ?

## 2022-07-27 NOTE — Patient Instructions (Signed)
[facility]  Discharge Instructions: Thank you for choosing [facility] to provide your oncology and hematology care.  If you have a lab appointment with the Cancer Center, please come in thru the Main Entrance and check in at the main information desk.  Wear comfortable clothing and clothing appropriate for easy access to any Portacath or PICC line.   We strive to give you quality time with your provider. You may need to reschedule your appointment if you arrive late (15 or more minutes).  Arriving late affects you and other patients whose appointments are after yours.  Also, if you miss three or more appointments without notifying the office, you may be dismissed from the clinic at the provider's discretion.      For prescription refill requests, have your pharmacy contact our office and allow 72 hours for refills to be completed.    Vitamin B12 Injection What is this medication? Vitamin B12 (VAHY tuh min B12) prevents and treats low vitamin B12 levels in your body. It is used in people who do not get enough vitamin B12 from their diet or when their digestive tract does not absorb enough. Vitamin B12 plays an important role in maintaining the health of your nervous system and red blood cells. This medicine may be used for other purposes; ask your health care provider or pharmacist if you have questions. COMMON BRAND NAME(S): B-12 Compliance Kit, B-12 Injection Kit, Cyomin, Dodex, LA-12, Nutri-Twelve, Physicians EZ Use B-12, Primabalt What should I tell my care team before I take this medication? They need to know if you have any of these conditions: Kidney disease Leber's disease Megaloblastic anemia An unusual or allergic reaction to cyanocobalamin, cobalt, other medications, foods, dyes, or preservatives Pregnant or trying to get pregnant Breast-feeding How should I use this medication? This medication is injected into a muscle or deeply under the skin.  It is usually given in a clinic or care team's office. However, your care team may teach you how to inject yourself. Follow all instructions. Talk to your care team about the use of this medication in children. Special care may be needed. Overdosage: If you think you have taken too much of this medicine contact a poison control center or emergency room at once. NOTE: This medicine is only for you. Do not share this medicine with others. What if I miss a dose? If you are given your dose at a clinic or care team's office, call to reschedule your appointment. If you give your own injections, and you miss a dose, take it as soon as you can. If it is almost time for your next dose, take only that dose. Do not take double or extra doses. What may interact with this medication? Alcohol Colchicine This list may not describe all possible interactions. Give your health care provider a list of all the medicines, herbs, non-prescription drugs, or dietary supplements you use. Also tell them if you smoke, drink alcohol, or use illegal drugs. Some items may interact with your medicine. What should I watch for while using this medication? Visit your care team regularly. You may need blood work done while you are taking this medication. You may need to follow a special diet. Talk to your care team. Limit your alcohol intake and avoid smoking to get the best benefit. What side effects may I notice from receiving this medication? Side effects that you should report to your care team as soon as possible: Allergic reactions--skin rash,   itching, hives, swelling of the face, lips, tongue, or throat Swelling of the ankles, hands, or feet Trouble breathing Side effects that usually do not require medical attention (report to your care team if they continue or are bothersome): Diarrhea This list may not describe all possible side effects. Call your doctor for medical advice about side effects. You may report side effects  to FDA at 1-800-FDA-1088. Where should I keep my medication? Keep out of the reach of children. Store at room temperature between 15 and 30 degrees C (59 and 85 degrees F). Protect from light. Throw away any unused medication after the expiration date. NOTE: This sheet is a summary. It may not cover all possible information. If you have questions about this medicine, talk to your doctor, pharmacist, or health care provider.  2023 Elsevier/Gold Standard (2007-09-01 00:00:00)    To help prevent nausea and vomiting after your treatment, we encourage you to take your nausea medication as directed.  BELOW ARE SYMPTOMS THAT SHOULD BE REPORTED IMMEDIATELY: *FEVER GREATER THAN 100.4 F (38 C) OR HIGHER *CHILLS OR SWEATING *NAUSEA AND VOMITING THAT IS NOT CONTROLLED WITH YOUR NAUSEA MEDICATION *UNUSUAL SHORTNESS OF BREATH *UNUSUAL BRUISING OR BLEEDING *URINARY PROBLEMS (pain or burning when urinating, or frequent urination) *BOWEL PROBLEMS (unusual diarrhea, constipation, pain near the anus) TENDERNESS IN MOUTH AND THROAT WITH OR WITHOUT PRESENCE OF ULCERS (sore throat, sores in mouth, or a toothache) UNUSUAL RASH, SWELLING OR PAIN  UNUSUAL VAGINAL DISCHARGE OR ITCHING   Items with * indicate a potential emergency and should be followed up as soon as possible or go to the Emergency Department if any problems should occur.  Please show the CHEMOTHERAPY ALERT CARD or IMMUNOTHERAPY ALERT CARD at check-in to the Emergency Department and triage nurse.  Should you have questions after your visit or need to cancel or reschedule your appointment, please contact [facility] [phone]  and follow the prompts.  Office hours are 8:00 a.m. to 4:30 p.m. Monday - Friday. Please note that voicemails left after 4:00 p.m. may not be returned until the following business day.  We are closed weekends and major holidays. You have access to a nurse at all times for urgent questions. Please call  the main number to the clinic [phone] and follow the prompts.  For any non-urgent questions, you may also contact your provider using MyChart. We now offer e-Visits for anyone 18 and older to request care online for non-urgent symptoms. For details visit [email or web address].   Also download the MyChart app! Go to the app store, search "MyChart", open the app, select [place], and log in with your MyChart username and password.   

## 2022-08-03 ENCOUNTER — Inpatient Hospital Stay: Payer: No Typology Code available for payment source

## 2022-08-03 VITALS — BP 127/78 | HR 71 | Temp 98.4°F | Resp 18

## 2022-08-03 DIAGNOSIS — D509 Iron deficiency anemia, unspecified: Secondary | ICD-10-CM | POA: Diagnosis not present

## 2022-08-03 DIAGNOSIS — Z9884 Bariatric surgery status: Secondary | ICD-10-CM

## 2022-08-03 DIAGNOSIS — E538 Deficiency of other specified B group vitamins: Secondary | ICD-10-CM

## 2022-08-03 DIAGNOSIS — D500 Iron deficiency anemia secondary to blood loss (chronic): Secondary | ICD-10-CM

## 2022-08-03 MED ORDER — CYANOCOBALAMIN 1000 MCG/ML IJ SOLN
1000.0000 ug | Freq: Once | INTRAMUSCULAR | Status: AC
  Administered 2022-08-03: 1000 ug via INTRAMUSCULAR
  Filled 2022-08-03: qty 1

## 2022-08-10 ENCOUNTER — Inpatient Hospital Stay: Payer: No Typology Code available for payment source

## 2022-08-10 VITALS — BP 118/76 | HR 54 | Temp 97.9°F | Resp 18

## 2022-08-10 DIAGNOSIS — E538 Deficiency of other specified B group vitamins: Secondary | ICD-10-CM

## 2022-08-10 DIAGNOSIS — D509 Iron deficiency anemia, unspecified: Secondary | ICD-10-CM | POA: Diagnosis not present

## 2022-08-10 DIAGNOSIS — D500 Iron deficiency anemia secondary to blood loss (chronic): Secondary | ICD-10-CM

## 2022-08-10 DIAGNOSIS — Z9884 Bariatric surgery status: Secondary | ICD-10-CM

## 2022-08-10 MED ORDER — LORATADINE 10 MG PO TABS
10.0000 mg | ORAL_TABLET | Freq: Once | ORAL | Status: AC
  Administered 2022-08-10: 10 mg via ORAL
  Filled 2022-08-10: qty 1

## 2022-08-10 MED ORDER — SODIUM CHLORIDE 0.9 % IV SOLN: Freq: Once | INTRAVENOUS | Status: AC

## 2022-08-10 MED ORDER — SODIUM CHLORIDE 0.9 % IV SOLN
300.0000 mg | Freq: Once | INTRAVENOUS | Status: AC
  Administered 2022-08-10: 300 mg via INTRAVENOUS
  Filled 2022-08-10: qty 300

## 2022-08-10 MED ORDER — ACETAMINOPHEN 325 MG PO TABS
650.0000 mg | ORAL_TABLET | Freq: Once | ORAL | Status: AC
  Administered 2022-08-10: 650 mg via ORAL
  Filled 2022-08-10: qty 2

## 2022-08-10 NOTE — Patient Instructions (Signed)
[facility]  Discharge Instructions: Thank you for choosing [facility] to provide your oncology and hematology care.  If you have a lab appointment with the Cancer Center, please come in thru the Main Entrance and check in at the main information desk.  Wear comfortable clothing and clothing appropriate for easy access to any Portacath or PICC line.   We strive to give you quality time with your provider. You may need to reschedule your appointment if you arrive late (15 or more minutes).  Arriving late affects you and other patients whose appointments are after yours.  Also, if you miss three or more appointments without notifying the office, you may be dismissed from the clinic at the provider's discretion.      For prescription refill requests, have your pharmacy contact our office and allow 72 hours for refills to be completed.    Today you received the following chemotherapy and/or immunotherapy agents Venofer IV iron infusion   To help prevent nausea and vomiting after your treatment, we encourage you to take your nausea medication as directed.  BELOW ARE SYMPTOMS THAT SHOULD BE REPORTED IMMEDIATELY: *FEVER GREATER THAN 100.4 F (38 C) OR HIGHER *CHILLS OR SWEATING *NAUSEA AND VOMITING THAT IS NOT CONTROLLED WITH YOUR NAUSEA MEDICATION *UNUSUAL SHORTNESS OF BREATH *UNUSUAL BRUISING OR BLEEDING *URINARY PROBLEMS (pain or burning when urinating, or frequent urination) *BOWEL PROBLEMS (unusual diarrhea, constipation, pain near the anus) TENDERNESS IN MOUTH AND THROAT WITH OR WITHOUT PRESENCE OF ULCERS (sore throat, sores in mouth, or a toothache) UNUSUAL RASH, SWELLING OR PAIN  UNUSUAL VAGINAL DISCHARGE OR ITCHING   Items with * indicate a potential emergency and should be followed up as soon as possible or go to the Emergency Department if any problems should occur.  Please show the CHEMOTHERAPY ALERT CARD or IMMUNOTHERAPY ALERT CARD at check-in to  the Emergency Department and triage nurse.  Should you have questions after your visit or need to cancel or reschedule your appointment, please contact [facility] [phone]  and follow the prompts.  Office hours are 8:00 a.m. to 4:30 p.m. Monday - Friday. Please note that voicemails left after 4:00 p.m. may not be returned until the following business day.  We are closed weekends and major holidays. You have access to a nurse at all times for urgent questions. Please call the main number to the clinic [phone] and follow the prompts.  For any non-urgent questions, you may also contact your provider using MyChart. We now offer e-Visits for anyone 18 and older to request care online for non-urgent symptoms. For details visit [email or web address].   Also download the MyChart app! Go to the app store, search "MyChart", open the app, select , and log in with your MyChart username and password.   

## 2022-08-10 NOTE — Progress Notes (Signed)
Pt presents today for Venofer IV iron infusion per provider's order. Vital signs stable and pt voiced no new complaints at this time.  Peripheral IV started with good blood return pre and post infusion.  Venofer 300 mg  given today per MD orders. Tolerated infusion without adverse affects. Vital signs stable. No complaints at this time. Discharged from clinic ambulatory in stable condition. Alert and oriented x 3. F/U with [facility] as scheduled.   

## 2022-08-17 ENCOUNTER — Inpatient Hospital Stay: Payer: No Typology Code available for payment source

## 2022-08-17 VITALS — BP 122/65 | HR 68 | Temp 97.0°F | Resp 18

## 2022-08-17 DIAGNOSIS — D509 Iron deficiency anemia, unspecified: Secondary | ICD-10-CM | POA: Diagnosis not present

## 2022-08-17 DIAGNOSIS — D500 Iron deficiency anemia secondary to blood loss (chronic): Secondary | ICD-10-CM

## 2022-08-17 DIAGNOSIS — E538 Deficiency of other specified B group vitamins: Secondary | ICD-10-CM

## 2022-08-17 DIAGNOSIS — Z9884 Bariatric surgery status: Secondary | ICD-10-CM

## 2022-08-17 MED ORDER — SODIUM CHLORIDE 0.9 % IV SOLN: Freq: Once | INTRAVENOUS | Status: AC

## 2022-08-17 MED ORDER — ACETAMINOPHEN 325 MG PO TABS
650.0000 mg | ORAL_TABLET | Freq: Once | ORAL | Status: AC
  Administered 2022-08-17: 650 mg via ORAL
  Filled 2022-08-17: qty 2

## 2022-08-17 MED ORDER — LORATADINE 10 MG PO TABS
10.0000 mg | ORAL_TABLET | Freq: Once | ORAL | Status: AC
  Administered 2022-08-17: 10 mg via ORAL
  Filled 2022-08-17: qty 1

## 2022-08-17 MED ORDER — SODIUM CHLORIDE 0.9 % IV SOLN
300.0000 mg | Freq: Once | INTRAVENOUS | Status: AC
  Administered 2022-08-17: 300 mg via INTRAVENOUS
  Filled 2022-08-17: qty 300

## 2022-08-17 NOTE — Progress Notes (Signed)
Patient presents today for Venofer infusion per providers order.  Vital signs WNL.  Patient has no new complaints at this time.  Peripheral IV started and blood return noted pre and post infusion.  Stable during infusion without adverse affects.  Vital signs stable.  No complaints at this time.  Discharge from clinic ambulatory in stable condition.  Alert and oriented X 3.  Follow up with [facility] as scheduled.  

## 2022-08-17 NOTE — Patient Instructions (Signed)
[facility]  Discharge Instructions: Thank you for choosing [facility] to provide your oncology and hematology care.  If you have a lab appointment with the Cancer Center, please come in thru the Main Entrance and check in at the main information desk.  Wear comfortable clothing and clothing appropriate for easy access to any Portacath or PICC line.   We strive to give you quality time with your provider. You may need to reschedule your appointment if you arrive late (15 or more minutes).  Arriving late affects you and other patients whose appointments are after yours.  Also, if you miss three or more appointments without notifying the office, you may be dismissed from the clinic at the provider's discretion.      For prescription refill requests, have your pharmacy contact our office and allow 72 hours for refills to be completed.    Today you received the following chemotherapy and/or immunotherapy agents Venofer      To help prevent nausea and vomiting after your treatment, we encourage you to take your nausea medication as directed.  BELOW ARE SYMPTOMS THAT SHOULD BE REPORTED IMMEDIATELY: *FEVER GREATER THAN 100.4 F (38 C) OR HIGHER *CHILLS OR SWEATING *NAUSEA AND VOMITING THAT IS NOT CONTROLLED WITH YOUR NAUSEA MEDICATION *UNUSUAL SHORTNESS OF BREATH *UNUSUAL BRUISING OR BLEEDING *URINARY PROBLEMS (pain or burning when urinating, or frequent urination) *BOWEL PROBLEMS (unusual diarrhea, constipation, pain near the anus) TENDERNESS IN MOUTH AND THROAT WITH OR WITHOUT PRESENCE OF ULCERS (sore throat, sores in mouth, or a toothache) UNUSUAL RASH, SWELLING OR PAIN  UNUSUAL VAGINAL DISCHARGE OR ITCHING   Items with * indicate a potential emergency and should be followed up as soon as possible or go to the Emergency Department if any problems should occur.  Please show the CHEMOTHERAPY ALERT CARD or IMMUNOTHERAPY ALERT CARD at check-in to the Emergency  Department and triage nurse.  Should you have questions after your visit or need to cancel or reschedule your appointment, please contact [facility] [phone]  and follow the prompts.  Office hours are 8:00 a.m. to 4:30 p.m. Monday - Friday. Please note that voicemails left after 4:00 p.m. may not be returned until the following business day.  We are closed weekends and major holidays. You have access to a nurse at all times for urgent questions. Please call the main number to the clinic [phone] and follow the prompts.  For any non-urgent questions, you may also contact your provider using MyChart. We now offer e-Visits for anyone 18 and older to request care online for non-urgent symptoms. For details visit [email or web address].   Also download the MyChart app! Go to the app store, search "MyChart", open the app, select [facility], and log in with your MyChart username and password.   

## 2022-08-24 ENCOUNTER — Inpatient Hospital Stay: Payer: No Typology Code available for payment source

## 2022-08-24 VITALS — BP 128/84 | HR 53 | Temp 97.2°F | Resp 16

## 2022-08-24 DIAGNOSIS — E538 Deficiency of other specified B group vitamins: Secondary | ICD-10-CM

## 2022-08-24 DIAGNOSIS — Z9884 Bariatric surgery status: Secondary | ICD-10-CM

## 2022-08-24 DIAGNOSIS — D500 Iron deficiency anemia secondary to blood loss (chronic): Secondary | ICD-10-CM

## 2022-08-24 DIAGNOSIS — D509 Iron deficiency anemia, unspecified: Secondary | ICD-10-CM | POA: Diagnosis not present

## 2022-08-24 MED ORDER — LORATADINE 10 MG PO TABS: 10.0000 mg | ORAL_TABLET | Freq: Once | ORAL | Status: DC

## 2022-08-24 MED ORDER — SODIUM CHLORIDE 0.9 % IV SOLN: Freq: Once | INTRAVENOUS | Status: AC

## 2022-08-24 MED ORDER — SODIUM CHLORIDE 0.9 % IV SOLN
300.0000 mg | Freq: Once | INTRAVENOUS | Status: AC
  Administered 2022-08-24: 300 mg via INTRAVENOUS
  Filled 2022-08-24: qty 15

## 2022-08-24 MED ORDER — CETIRIZINE HCL 10 MG PO TABS
10.0000 mg | ORAL_TABLET | Freq: Once | ORAL | Status: AC
  Administered 2022-08-24: 10 mg via ORAL
  Filled 2022-08-24: qty 1

## 2022-08-24 MED ORDER — ACETAMINOPHEN 325 MG PO TABS
650.0000 mg | ORAL_TABLET | Freq: Once | ORAL | Status: AC
  Administered 2022-08-24: 650 mg via ORAL
  Filled 2022-08-24: qty 2

## 2022-08-24 NOTE — Progress Notes (Signed)
Patient tolerated iron infusion with no complaints voiced.  Peripheral IV site clean and dry with good blood return noted before and after infusion.  Band aid applied.  VSS with discharge and left in satisfactory condition with no s/s of distress noted.   

## 2022-08-24 NOTE — Patient Instructions (Signed)
[facility]  Discharge Instructions: Thank you for choosing [facility] to provide your oncology and hematology care.  If you have a lab appointment with the Cancer Center, please come in thru the Main Entrance and check in at the main information desk.  Wear comfortable clothing and clothing appropriate for easy access to any Portacath or PICC line.   We strive to give you quality time with your provider. You may need to reschedule your appointment if you arrive late (15 or more minutes).  Arriving late affects you and other patients whose appointments are after yours.  Also, if you miss three or more appointments without notifying the office, you may be dismissed from the clinic at the provider's discretion.      For prescription refill requests, have your pharmacy contact our office and allow 72 hours for refills to be completed.    Today you received the following Venofer, return as scheduled.   To help prevent nausea and vomiting after your treatment, we encourage you to take your nausea medication as directed.  BELOW ARE SYMPTOMS THAT SHOULD BE REPORTED IMMEDIATELY: *FEVER GREATER THAN 100.4 F (38 C) OR HIGHER *CHILLS OR SWEATING *NAUSEA AND VOMITING THAT IS NOT CONTROLLED WITH YOUR NAUSEA MEDICATION *UNUSUAL SHORTNESS OF BREATH *UNUSUAL BRUISING OR BLEEDING *URINARY PROBLEMS (pain or burning when urinating, or frequent urination) *BOWEL PROBLEMS (unusual diarrhea, constipation, pain near the anus) TENDERNESS IN MOUTH AND THROAT WITH OR WITHOUT PRESENCE OF ULCERS (sore throat, sores in mouth, or a toothache) UNUSUAL RASH, SWELLING OR PAIN  UNUSUAL VAGINAL DISCHARGE OR ITCHING   Items with * indicate a potential emergency and should be followed up as soon as possible or go to the Emergency Department if any problems should occur.  Please show the CHEMOTHERAPY ALERT CARD or IMMUNOTHERAPY ALERT CARD at check-in to the Emergency Department and triage  nurse.  Should you have questions after your visit or need to cancel or reschedule your appointment, please contact [facility] [phone]  and follow the prompts.  Office hours are 8:00 a.m. to 4:30 p.m. Monday - Friday. Please note that voicemails left after 4:00 p.m. may not be returned until the following business day.  We are closed weekends and major holidays. You have access to a nurse at all times for urgent questions. Please call the main number to the clinic [phone] and follow the prompts.  For any non-urgent questions, you may also contact your provider using MyChart. We now offer e-Visits for anyone 18 and older to request care online for non-urgent symptoms. For details visit [email or web address].   Also download the MyChart app! Go to the app store, search "MyChart", open the app, select [place], and log in with your MyChart username and password.   

## 2022-08-25 MED ORDER — OCTREOTIDE ACETATE 30 MG IM KIT
PACK | INTRAMUSCULAR | Status: AC
  Filled 2022-08-25: qty 1

## 2022-08-31 ENCOUNTER — Inpatient Hospital Stay: Payer: No Typology Code available for payment source

## 2022-08-31 ENCOUNTER — Inpatient Hospital Stay: Payer: No Typology Code available for payment source | Attending: Hematology

## 2022-08-31 VITALS — BP 142/99 | HR 62 | Temp 96.9°F | Resp 18

## 2022-08-31 DIAGNOSIS — Z9884 Bariatric surgery status: Secondary | ICD-10-CM

## 2022-08-31 DIAGNOSIS — E538 Deficiency of other specified B group vitamins: Secondary | ICD-10-CM | POA: Diagnosis present

## 2022-08-31 DIAGNOSIS — D500 Iron deficiency anemia secondary to blood loss (chronic): Secondary | ICD-10-CM

## 2022-08-31 DIAGNOSIS — D509 Iron deficiency anemia, unspecified: Secondary | ICD-10-CM | POA: Diagnosis present

## 2022-08-31 MED ORDER — CYANOCOBALAMIN 1000 MCG/ML IJ SOLN
1000.0000 ug | Freq: Once | INTRAMUSCULAR | Status: AC
  Administered 2022-08-31: 1000 ug via INTRAMUSCULAR
  Filled 2022-08-31: qty 1

## 2022-08-31 NOTE — Progress Notes (Signed)
Patient tolerated injection with no complaints voiced. Site clean and dry with no bruising or swelling noted at site. See MAR for details. Band aid applied.  Patient stable during and after injection. VSS with discharge and left in satisfactory condition with no s/s of distress noted.  

## 2022-08-31 NOTE — Patient Instructions (Signed)
[facility]  Discharge Instructions: Thank you for choosing [facility] to provide your oncology and hematology care.  If you have a lab appointment with the Cancer Center, please come in thru the Main Entrance and check in at the main information desk.  Wear comfortable clothing and clothing appropriate for easy access to any Portacath or PICC line.   We strive to give you quality time with your provider. You may need to reschedule your appointment if you arrive late (15 or more minutes).  Arriving late affects you and other patients whose appointments are after yours.  Also, if you miss three or more appointments without notifying the office, you may be dismissed from the clinic at the provider's discretion.      For prescription refill requests, have your pharmacy contact our office and allow 72 hours for refills to be completed.    Today you received the following B12 injection, return as scheduled.   To help prevent nausea and vomiting after your treatment, we encourage you to take your nausea medication as directed.  BELOW ARE SYMPTOMS THAT SHOULD BE REPORTED IMMEDIATELY: *FEVER GREATER THAN 100.4 F (38 C) OR HIGHER *CHILLS OR SWEATING *NAUSEA AND VOMITING THAT IS NOT CONTROLLED WITH YOUR NAUSEA MEDICATION *UNUSUAL SHORTNESS OF BREATH *UNUSUAL BRUISING OR BLEEDING *URINARY PROBLEMS (pain or burning when urinating, or frequent urination) *BOWEL PROBLEMS (unusual diarrhea, constipation, pain near the anus) TENDERNESS IN MOUTH AND THROAT WITH OR WITHOUT PRESENCE OF ULCERS (sore throat, sores in mouth, or a toothache) UNUSUAL RASH, SWELLING OR PAIN  UNUSUAL VAGINAL DISCHARGE OR ITCHING   Items with * indicate a potential emergency and should be followed up as soon as possible or go to the Emergency Department if any problems should occur.  Please show the CHEMOTHERAPY ALERT CARD or IMMUNOTHERAPY ALERT CARD at check-in to the Emergency Department and  triage nurse.  Should you have questions after your visit or need to cancel or reschedule your appointment, please contact [facility] [phone]  and follow the prompts.  Office hours are 8:00 a.m. to 4:30 p.m. Monday - Friday. Please note that voicemails left after 4:00 p.m. may not be returned until the following business day.  We are closed weekends and major holidays. You have access to a nurse at all times for urgent questions. Please call the main number to the clinic [phone] and follow the prompts.  For any non-urgent questions, you may also contact your provider using MyChart. We now offer e-Visits for anyone 18 and older to request care online for non-urgent symptoms. For details visit [email or web address].   Also download the MyChart app! Go to the app store, search "MyChart", open the app, select [facility], and log in with your MyChart username and password.   

## 2022-09-28 ENCOUNTER — Inpatient Hospital Stay: Payer: No Typology Code available for payment source

## 2022-09-30 ENCOUNTER — Inpatient Hospital Stay: Payer: No Typology Code available for payment source

## 2022-09-30 ENCOUNTER — Inpatient Hospital Stay: Payer: No Typology Code available for payment source | Attending: Hematology

## 2022-09-30 VITALS — BP 130/83 | HR 57 | Temp 97.5°F | Resp 18

## 2022-09-30 DIAGNOSIS — E538 Deficiency of other specified B group vitamins: Secondary | ICD-10-CM | POA: Insufficient documentation

## 2022-09-30 DIAGNOSIS — E559 Vitamin D deficiency, unspecified: Secondary | ICD-10-CM

## 2022-09-30 DIAGNOSIS — K912 Postsurgical malabsorption, not elsewhere classified: Secondary | ICD-10-CM | POA: Diagnosis not present

## 2022-09-30 DIAGNOSIS — D500 Iron deficiency anemia secondary to blood loss (chronic): Secondary | ICD-10-CM

## 2022-09-30 DIAGNOSIS — Z9884 Bariatric surgery status: Secondary | ICD-10-CM | POA: Diagnosis not present

## 2022-09-30 DIAGNOSIS — E611 Iron deficiency: Secondary | ICD-10-CM

## 2022-09-30 LAB — CBC WITH DIFFERENTIAL/PLATELET
Abs Immature Granulocytes: 0 10*3/uL (ref 0.00–0.07)
Basophils Absolute: 0 10*3/uL (ref 0.0–0.1)
Basophils Relative: 0 %
Eosinophils Absolute: 0 10*3/uL (ref 0.0–0.5)
Eosinophils Relative: 1 %
HCT: 40.9 % (ref 36.0–46.0)
Hemoglobin: 13.2 g/dL (ref 12.0–15.0)
Immature Granulocytes: 0 %
Lymphocytes Relative: 43 %
Lymphs Abs: 1.1 10*3/uL (ref 0.7–4.0)
MCH: 30.4 pg (ref 26.0–34.0)
MCHC: 32.3 g/dL (ref 30.0–36.0)
MCV: 94.2 fL (ref 80.0–100.0)
Monocytes Absolute: 0.3 10*3/uL (ref 0.1–1.0)
Monocytes Relative: 11 %
Neutro Abs: 1.2 10*3/uL — ABNORMAL LOW (ref 1.7–7.7)
Neutrophils Relative %: 45 %
Platelets: 149 10*3/uL — ABNORMAL LOW (ref 150–400)
RBC: 4.34 MIL/uL (ref 3.87–5.11)
RDW: 13.4 % (ref 11.5–15.5)
WBC: 2.6 10*3/uL — ABNORMAL LOW (ref 4.0–10.5)
nRBC: 0 % (ref 0.0–0.2)

## 2022-09-30 LAB — COMPREHENSIVE METABOLIC PANEL
ALT: 14 U/L (ref 0–44)
AST: 18 U/L (ref 15–41)
Albumin: 4 g/dL (ref 3.5–5.0)
Alkaline Phosphatase: 41 U/L (ref 38–126)
Anion gap: 6 (ref 5–15)
BUN: 20 mg/dL (ref 6–20)
CO2: 27 mmol/L (ref 22–32)
Calcium: 9.1 mg/dL (ref 8.9–10.3)
Chloride: 106 mmol/L (ref 98–111)
Creatinine, Ser: 0.87 mg/dL (ref 0.44–1.00)
GFR, Estimated: >60 mL/min (ref >60)
Glucose, Bld: 84 mg/dL (ref 70–99)
Potassium: 4.5 mmol/L (ref 3.5–5.1)
Sodium: 139 mmol/L (ref 135–145)
Total Bilirubin: 0.8 mg/dL (ref 0.3–1.2)
Total Protein: 7.5 g/dL (ref 6.5–8.1)

## 2022-09-30 LAB — IRON AND TIBC
Iron: 94 ug/dL (ref 28–170)
Saturation Ratios: 28 % (ref 10.4–31.8)
TIBC: 334 ug/dL (ref 250–450)
UIBC: 240 ug/dL

## 2022-09-30 LAB — FOLATE: Folate: 10.6 ng/mL (ref >5.9)

## 2022-09-30 LAB — VITAMIN B12: Vitamin B-12: 333 pg/mL (ref 180–914)

## 2022-09-30 LAB — FERRITIN: Ferritin: 151 ng/mL (ref 11–307)

## 2022-09-30 MED ORDER — CYANOCOBALAMIN 1000 MCG/ML IJ SOLN
1000.0000 ug | Freq: Once | INTRAMUSCULAR | Status: AC
  Administered 2022-09-30: 1000 ug via INTRAMUSCULAR
  Filled 2022-09-30: qty 1

## 2022-09-30 NOTE — Progress Notes (Signed)
Patient presents today for B12 injection per providers order.  Vital signs WNL.  Patient has no new complaints at this time.  Stable during administration without incident; injection site WNL; see MAR for injection details.  Patient tolerated procedure well and without incident.  No questions or complaints noted at this time.  

## 2022-10-04 LAB — METHYLMALONIC ACID, SERUM: Methylmalonic Acid, Quantitative: 152 nmol/L (ref 0–378)

## 2022-10-05 ENCOUNTER — Ambulatory Visit: Payer: No Typology Code available for payment source | Admitting: Physician Assistant

## 2022-10-11 NOTE — Progress Notes (Deleted)
NO SHOW

## 2022-10-13 ENCOUNTER — Inpatient Hospital Stay: Payer: No Typology Code available for payment source | Admitting: Physician Assistant

## 2022-10-28 ENCOUNTER — Inpatient Hospital Stay: Payer: No Typology Code available for payment source | Attending: Hematology

## 2023-01-23 ENCOUNTER — Other Ambulatory Visit: Payer: Self-pay

## 2023-01-23 DIAGNOSIS — E559 Vitamin D deficiency, unspecified: Secondary | ICD-10-CM

## 2023-01-23 DIAGNOSIS — E538 Deficiency of other specified B group vitamins: Secondary | ICD-10-CM

## 2023-01-23 DIAGNOSIS — D500 Iron deficiency anemia secondary to blood loss (chronic): Secondary | ICD-10-CM

## 2023-01-24 ENCOUNTER — Inpatient Hospital Stay: Payer: No Typology Code available for payment source | Attending: Hematology

## 2023-01-24 DIAGNOSIS — E559 Vitamin D deficiency, unspecified: Secondary | ICD-10-CM | POA: Insufficient documentation

## 2023-01-24 DIAGNOSIS — D500 Iron deficiency anemia secondary to blood loss (chronic): Secondary | ICD-10-CM

## 2023-01-24 DIAGNOSIS — E538 Deficiency of other specified B group vitamins: Secondary | ICD-10-CM | POA: Insufficient documentation

## 2023-01-24 DIAGNOSIS — D509 Iron deficiency anemia, unspecified: Secondary | ICD-10-CM | POA: Insufficient documentation

## 2023-01-24 LAB — COMPREHENSIVE METABOLIC PANEL
ALT: 13 U/L (ref 0–44)
AST: 16 U/L (ref 15–41)
Albumin: 3.8 g/dL (ref 3.5–5.0)
Alkaline Phosphatase: 41 U/L (ref 38–126)
Anion gap: 7 (ref 5–15)
BUN: 17 mg/dL (ref 6–20)
CO2: 25 mmol/L (ref 22–32)
Calcium: 8.9 mg/dL (ref 8.9–10.3)
Chloride: 107 mmol/L (ref 98–111)
Creatinine, Ser: 0.86 mg/dL (ref 0.44–1.00)
GFR, Estimated: >60 mL/min (ref >60)
Glucose, Bld: 85 mg/dL (ref 70–99)
Potassium: 3.9 mmol/L (ref 3.5–5.1)
Sodium: 139 mmol/L (ref 135–145)
Total Bilirubin: 0.6 mg/dL (ref 0.3–1.2)
Total Protein: 7.3 g/dL (ref 6.5–8.1)

## 2023-01-24 LAB — CBC WITH DIFFERENTIAL/PLATELET
Abs Immature Granulocytes: 0.01 10*3/uL (ref 0.00–0.07)
Basophils Absolute: 0 10*3/uL (ref 0.0–0.1)
Basophils Relative: 0 %
Eosinophils Absolute: 0 10*3/uL (ref 0.0–0.5)
Eosinophils Relative: 1 %
HCT: 40.3 % (ref 36.0–46.0)
Hemoglobin: 13.1 g/dL (ref 12.0–15.0)
Immature Granulocytes: 0 %
Lymphocytes Relative: 38 %
Lymphs Abs: 1.1 10*3/uL (ref 0.7–4.0)
MCH: 30.8 pg (ref 26.0–34.0)
MCHC: 32.5 g/dL (ref 30.0–36.0)
MCV: 94.8 fL (ref 80.0–100.0)
Monocytes Absolute: 0.3 10*3/uL (ref 0.1–1.0)
Monocytes Relative: 10 %
Neutro Abs: 1.5 10*3/uL — ABNORMAL LOW (ref 1.7–7.7)
Neutrophils Relative %: 51 %
Platelets: 148 10*3/uL — ABNORMAL LOW (ref 150–400)
RBC: 4.25 MIL/uL (ref 3.87–5.11)
RDW: 14.1 % (ref 11.5–15.5)
WBC: 3 10*3/uL — ABNORMAL LOW (ref 4.0–10.5)
nRBC: 0 % (ref 0.0–0.2)

## 2023-01-24 LAB — VITAMIN B12: Vitamin B-12: 637 pg/mL (ref 180–914)

## 2023-01-24 LAB — IRON AND TIBC
Iron: 99 ug/dL (ref 28–170)
Saturation Ratios: 37 % — ABNORMAL HIGH (ref 10.4–31.8)
TIBC: 270 ug/dL (ref 250–450)
UIBC: 171 ug/dL

## 2023-01-24 LAB — VITAMIN D 25 HYDROXY (VIT D DEFICIENCY, FRACTURES): Vit D, 25-Hydroxy: 27.74 ng/mL — ABNORMAL LOW (ref 30–100)

## 2023-01-24 LAB — FOLATE: Folate: 6.7 ng/mL (ref >5.9)

## 2023-01-24 LAB — FERRITIN: Ferritin: 188 ng/mL (ref 11–307)

## 2023-01-26 LAB — METHYLMALONIC ACID, SERUM: Methylmalonic Acid, Quantitative: 118 nmol/L (ref 0–378)

## 2023-01-31 ENCOUNTER — Inpatient Hospital Stay: Payer: No Typology Code available for payment source | Admitting: Oncology

## 2023-01-31 ENCOUNTER — Other Ambulatory Visit: Payer: No Typology Code available for payment source

## 2023-01-31 ENCOUNTER — Inpatient Hospital Stay: Payer: No Typology Code available for payment source

## 2023-01-31 NOTE — Progress Notes (Unsigned)
[facility] [address], [place] [address]   CLINIC:  Medical Oncology/Hematology  PCP:  Patient, No Pcp Per No address on file None   REASON FOR VISIT:  Follow-up for iron deficiency anemia and B12 deficiency  CURRENT THERAPY: Intermittent IV iron infusions, vitamin B12 injections  INTERVAL HISTORY:   Candice Ross 52 y.o. female returns for routine follow-up of her iron deficiency anemia and vitamin B12 deficiency.  She was last seen by Rojelio Brenner PA-C on 07/12/2022.  She missed her 61-month follow-up appointment, and returns today to reestablish care.  At today's visit, she reports feeling fair.  No recent hospitalizations, surgeries, or changes in baseline health status.  She reports that she was having severe vaginal bleeding and pelvic pain for the past several months, which resolved after her gynecologist removed IUD.  She has instead been placed on low Loestrin, and is feeling much better.  She reports that her vaginal bleeding finally resolved this past Sunday.  She has not had any rectal bleeding or melena.  She reports some fatigue, but reports that her energy is starting to improve now that she is stopped bleeding.  She denies any significant lightheadedness, ice pica, restless legs, headaches, chest pain, dyspnea on exertion, or syncope.  No B symptoms or frequent infections.  She has 50% energy and 80% appetite. She endorses that she is maintaining a stable weight.  ASSESSMENT & PLAN:  1.  Iron deficiency anemia - Etiology is secondary to malabsorption (history of gastric bypass, chronic PPI therapy) and blood loss (history of anastomotic ulcer) - EGD (01/13/2014 by Dr. Darrick Penna): Small anastomotic ulcer, mild gastritis, gastric polyps - Colonoscopy (01/13/2014): Redundant colon, moderate-sized external hemorrhoids - She failed to improve on oral iron and experienced significant constipation. - Last IV iron with Venofer 300 mg x 3 in January  2024 - No rectal bleeding or melena.  Experienced several months of menorrhagia in early 2024, which resolved after IUD was removed. - Most recent labs (01/24/2023): Hgb 13.1/MCV 94.8, ferritin 188, iron saturation 37%.  CMP normal.  Chronic leukopenia stable at baseline, but she has some new mild thrombocytopenia with platelets 148. - PLAN: No indication for IV iron at this time.  Recheck CBC with iron panel in 6 months.   - Attention to platelets at follow-up, we will also check immature platelet fraction    2.  Vitamin B12 deficiency - Likely secondary to malabsorption from gastric bypass surgery - Labs from December 2023 (07/06/2022): Vitamin B12 198 with MMA normal at 134 - Unable to tolerate oral supplementation, reports that it "upsets her stomach." - Started vitamin B12 injections in December 2023 - Most recent labs (01/24/2023): Vitamin B12 improved at 637, normal MMA.  Normal folate. - PLAN: Patient would like to try self administering B12 injections at home once a month.  Will recheck B12/MMA in 6 months.   3.  Vitamin D deficiency - Most recent labs (07/06/2022): Vitamin D mildly low at 24.12 - She was previously taking weekly vitamin D 50,000 units, but this "upset her stomach" - She is taking vitamin D 1000 mcg daily, and is tolerating this much better - Most recent labs (01/24/2023): Vitamin D improved but mildly low at 27.74 - PLAN: Recommend increasing vitamin D to 2000 units daily.  Recheck in 6 months.  4.  Leukopenia - Review of past labs shows the patient has chronic leukopenia/low-normal WBCs since at least 2014, with WBC ranging from 2.9 to 5.2.  Lowest ANC was 1.3 (09/26/2017) - Complete neutropenia work-up on patient.  Hepatitis B, hepatitis C, HIV were negative.  ANA was negative.  Flow cytometry was negative for any monoclonal B-cell population. 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 Negative for appetite change, chills, diaphoresis, fever and unexpected weight change.  HENT:   Negative for lump/mass and nosebleeds.   Eyes:  Negative for eye problems.  Respiratory:  Negative for cough, hemoptysis and shortness of breath.   Cardiovascular:  Negative for chest pain, leg swelling and palpitations.  Gastrointestinal:  Negative for abdominal pain, blood in stool, constipation, diarrhea, nausea and vomiting.  Genitourinary:  Negative for hematuria.   Skin: Negative.   Neurological:  Negative for dizziness, headaches and light-headedness.  Hematological:  Does not bruise/bleed easily.  Psychiatric/Behavioral:  Positive for sleep disturbance.      PHYSICAL EXAM:  ECOG PERFORMANCE STATUS: 1 - Symptomatic but completely ambulatory  There were no vitals filed for this visit. There were no vitals filed for this visit. Physical Exam Constitutional:      Appearance: Normal appearance. She is normal weight.  Cardiovascular:     Heart sounds: Normal heart sounds.  Pulmonary:     Breath sounds: Normal breath sounds.  Neurological:     General: No focal deficit present.     Mental Status: Mental status is at baseline.  Psychiatric:        Behavior: Behavior normal. Behavior is cooperative.     PAST MEDICAL/SURGICAL HISTORY:  Past Medical History:  Diagnosis Date   Anemia    Iron deficiency anemia due to chronic blood loss 12/31/2013   Ulcerative colitis (HCC)    POSSIBLE   Past Surgical History:  Procedure Laterality Date   COLONOSCOPY N/A 01/13/2014   Procedure: COLONOSCOPY;  Surgeon: West Bali, MD;  Location: [facility];  Service: Endoscopy;  Laterality: N/A;  10:30    ESOPHAGOGASTRODUODENOSCOPY N/A 01/13/2014   Procedure: ESOPHAGOGASTRODUODENOSCOPY (EGD);  Surgeon: West Bali, MD;  Location: [facility];  Service: Endoscopy;  Laterality: N/A;   [place]    SOCIAL HISTORY:  Social History   Socioeconomic History   Marital status: Divorced    Spouse name: Not on file   Number of children: Not on file   Years of education: Not on file   Highest education level: Not on file  Occupational History   Occupation: Firefighter   Occupation: Water quality scientist for [employer]  Tobacco Use   Smoking status: Never   Smokeless tobacco: Never  Substance and Sexual Activity   Alcohol use: No   Drug use: No   Sexual activity: Yes    Birth control/protection: I.U.D.  Other Topics Concern   Not on file  Social History Narrative   Not on file   Social Determinants of Health   Financial Resource Strain: Not on file  Food Insecurity: Not on file  Transportation Needs: Not on file  Physical Activity: Not on file  Stress: Not on file  Social Connections: Not on file  Intimate Partner Violence: Not on file    FAMILY  HISTORY:  Family History  Problem Relation Age of Onset   Colon cancer Neg Hx     CURRENT MEDICATIONS:  Outpatient Encounter Medications as of 02/01/2023  Medication Sig   amoxicillin (AMOXIL) 875 MG tablet SMARTSIG:1 Tablet(s) By Mouth Every 12 Hours   cholecalciferol (VITAMIN D3) 25 MCG (1000 UNIT) tablet Take 1 tablet (1,000 Units total) by mouth daily.   Facility-Administered Encounter Medications as of 02/01/2023  Medication   0.9 %  sodium chloride infusion   octreotide (SANDOSTATIN LAR) 30 MG IM injection    ALLERGIES:  Allergies  Allergen Reactions   Morphine And Codeine     LABORATORY DATA:  I have reviewed the labs as listed.  CBC    Component Value Date/Time   WBC 3.0 (L) 01/24/2023 0805   RBC 4.25 01/24/2023 0805   HGB 13.1 01/24/2023 0805   HGB 8.6 (L) 03/07/2013 1546   HCT  40.3 01/24/2023 0805   HCT 27.9 (L) 03/07/2013 1546   PLT 148 (L) 01/24/2023 0805   PLT 299 03/07/2013 1546   MCV 94.8 01/24/2023 0805   MCV 65 (L) 03/07/2013 1546   MCH 30.8 01/24/2023 0805   MCHC 32.5 01/24/2023 0805   RDW 14.1 01/24/2023 0805   RDW 20.7 (H) 03/07/2013 1546   LYMPHSABS 1.1 01/24/2023 0805   MONOABS 0.3 01/24/2023 0805   EOSABS 0.0 01/24/2023 0805   BASOSABS 0.0 01/24/2023 0805      Latest Ref Rng & Units 01/24/2023    8:05 AM 09/30/2022    9:07 AM 07/06/2022   10:32 AM  CMP  Glucose 70 - 99 mg/dL 85  84  80   BUN 6 - 20 mg/dL 17  20  14    Creatinine 0.44 - 1.00 mg/dL 1.61  0.96  0.45   Sodium 135 - 145 mmol/L 139  139  137   Potassium 3.5 - 5.1 mmol/L 3.9  4.5  4.0   Chloride 98 - 111 mmol/L 107  106  105   CO2 22 - 32 mmol/L 25  27  24    Calcium 8.9 - 10.3 mg/dL 8.9  9.1  8.9   Total Protein 6.5 - 8.1 g/dL 7.3  7.5  7.8   Total Bilirubin 0.3 - 1.2 mg/dL 0.6  0.8  0.8   Alkaline Phos 38 - 126 U/L 41  41  46   AST 15 - 41 U/L 16  18  18    ALT 0 - 44 U/L 13  14  12      DIAGNOSTIC IMAGING:  I have independently reviewed the relevant imaging and discussed with the patient.   WRAP UP:  All questions were answered. The patient knows to call the clinic with any problems, questions or concerns.  Medical decision making: Moderate  Time spent on visit: I spent 20 minutes counseling the patient face to face. The total time spent in the appointment was 30 minutes and more than 50% was on counseling.  Carnella Guadalajara, PA-C  02/01/23 9:41 AM

## 2023-02-01 ENCOUNTER — Inpatient Hospital Stay (HOSPITAL_BASED_OUTPATIENT_CLINIC_OR_DEPARTMENT_OTHER): Payer: No Typology Code available for payment source | Admitting: Physician Assistant

## 2023-02-01 ENCOUNTER — Inpatient Hospital Stay: Payer: No Typology Code available for payment source

## 2023-02-01 VITALS — BP 121/83 | HR 74 | Temp 98.6°F | Resp 16 | Wt 163.6 lb

## 2023-02-01 DIAGNOSIS — Z9884 Bariatric surgery status: Secondary | ICD-10-CM | POA: Diagnosis not present

## 2023-02-01 DIAGNOSIS — E538 Deficiency of other specified B group vitamins: Secondary | ICD-10-CM

## 2023-02-01 DIAGNOSIS — D500 Iron deficiency anemia secondary to blood loss (chronic): Secondary | ICD-10-CM | POA: Diagnosis not present

## 2023-02-01 DIAGNOSIS — D72819 Decreased white blood cell count, unspecified: Secondary | ICD-10-CM

## 2023-02-01 DIAGNOSIS — E559 Vitamin D deficiency, unspecified: Secondary | ICD-10-CM

## 2023-02-01 DIAGNOSIS — K9089 Other intestinal malabsorption: Secondary | ICD-10-CM

## 2023-02-01 MED ORDER — NEEDLES & SYRINGES MISC
1.0000 | 0 refills | Status: DC
Start: 2023-02-01 — End: 2023-08-10

## 2023-02-01 MED ORDER — CYANOCOBALAMIN 1000 MCG/ML IJ SOLN
1000.0000 ug | INTRAMUSCULAR | 1 refills | Status: AC
Start: 2023-02-01 — End: ?

## 2023-02-01 MED ORDER — CYANOCOBALAMIN 1000 MCG/ML IJ SOLN
1000.0000 ug | Freq: Once | INTRAMUSCULAR | Status: AC
  Administered 2023-02-01: 1000 ug via INTRAMUSCULAR
  Filled 2023-02-01: qty 1

## 2023-02-01 NOTE — Progress Notes (Signed)
Candice Ross presents today for B12 injection per the provider's orders.  Demonstrated to patient how to self inject.  Patient expressed understanding.  Stable during administration without incident; injection site WNL; see MAR for injection details.  Patient tolerated procedure well and without incident.  No questions or complaints noted at this time.

## 2023-02-01 NOTE — Patient Instructions (Signed)
[facility]  Discharge Instructions: Thank you for choosing [facility] to provide your oncology and hematology care.  If you have a lab appointment with the Cancer Center - please note that after April 8th, 2024, all labs will be drawn in the cancer center.  You do not have to check in or register with the main entrance as you have in the past but will complete your check-in in the cancer center.  Wear comfortable clothing and clothing appropriate for easy access to any Portacath or PICC line.   We strive to give you quality time with your provider. You may need to reschedule your appointment if you arrive late (15 or more minutes).  Arriving late affects you and other patients whose appointments are after yours.  Also, if you miss three or more appointments without notifying the office, you may be dismissed from the clinic at the provider's discretion.      For prescription refill requests, have your pharmacy contact our office and allow 72 hours for refills to be completed.    Today you received the following chemotherapy and/or immunotherapy agents B12      To help prevent nausea and vomiting after your treatment, we encourage you to take your nausea medication as directed.  BELOW ARE SYMPTOMS THAT SHOULD BE REPORTED IMMEDIATELY: *FEVER GREATER THAN 100.4 F (38 C) OR HIGHER *CHILLS OR SWEATING *NAUSEA AND VOMITING THAT IS NOT CONTROLLED WITH YOUR NAUSEA MEDICATION *UNUSUAL SHORTNESS OF BREATH *UNUSUAL BRUISING OR BLEEDING *URINARY PROBLEMS (pain or burning when urinating, or frequent urination) *BOWEL PROBLEMS (unusual diarrhea, constipation, pain near the anus) TENDERNESS IN MOUTH AND THROAT WITH OR WITHOUT PRESENCE OF ULCERS (sore throat, sores in mouth, or a toothache) UNUSUAL RASH, SWELLING OR PAIN  UNUSUAL VAGINAL DISCHARGE OR ITCHING   Items with * indicate a potential emergency and should be followed up as soon as possible or go to the  Emergency Department if any problems should occur.  Please show the CHEMOTHERAPY ALERT CARD or IMMUNOTHERAPY ALERT CARD at check-in to the Emergency Department and triage nurse.  Should you have questions after your visit or need to cancel or reschedule your appointment, please contact [facility] [phone]  and follow the prompts.  Office hours are 8:00 a.m. to 4:30 p.m. Monday - Friday. Please note that voicemails left after 4:00 p.m. may not be returned until the following business day.  We are closed weekends and major holidays. You have access to a nurse at all times for urgent questions. Please call the main number to the clinic [phone] and follow the prompts.  For any non-urgent questions, you may also contact your provider using MyChart. We now offer e-Visits for anyone 18 and older to request care online for non-urgent symptoms. For details visit [email or web address].   Also download the MyChart app! Go to the app store, search "MyChart", open the app, select [facility], and log in with your MyChart username and password.   

## 2023-02-01 NOTE — Patient Instructions (Signed)
[facility] **VISIT SUMMARY & IMPORTANT INSTRUCTIONS **   You were seen today by Rojelio Brenner PA-C for your iron deficiency and vitamin B12 deficiency.    NUTRIENT DEFICIENCIES: You have several vitamin and mineral deficiencies due to malabsorption following gastric bypass surgery.  We will treat your vitamin/mineral deficiencies with the following: You do not need any IV iron at this time. Vitamin B12 injections once a month.  You can give these to yourself at home.  Prescription will be sent to your pharmacy.  Please see the attached handout for further instructions. INCREASE your Vitamin D to 2,000 mcg supplement daily (available over-the-counter)  FOLLOW-UP APPOINTMENT: We will check labs and discuss with a phone visit in about 6 months.  ** Thank you for trusting me with your healthcare!  I strive to provide all of my patients with quality care at each visit.  If you receive a survey for this visit, I would be so grateful to you for taking the time to provide feedback.  Thank you in advance!  ~ Oral Hallgren                   Dr. Doreatha Massed   &   Rojelio Brenner, PA-C   - - - - - - - - - - - - - - - - - -    Thank you for choosing [facility] to provide your oncology and hematology care.  To afford each patient quality time with our provider, please arrive at least 15 minutes before your scheduled appointment time.   If you have a lab appointment with the Cancer Center please come in thru the Main Entrance and check in at the main information desk.  You need to re-schedule your appointment should you arrive 10 or more minutes late.  We strive to give you quality time with our providers, and arriving late affects you and other patients whose appointments are after yours.  Also, if you no show three or more times for appointments you may be dismissed from the clinic at the providers discretion.     Again,  thank you for choosing [facility].  Our hope is that these requests will decrease the amount of time that you wait before being seen by our physicians.       _____________________________________________________________  Should you have questions after your visit to [facility], please contact our office at [phone] and follow the prompts.  Our office hours are 8:00 a.m. and 4:30 p.m. Monday - Friday.  Please note that voicemails left after 4:00 p.m. may not be returned until the following business day.  We are closed weekends and major holidays.  You do have access to a nurse 24-7, just call the main number to the clinic [phone] and do not press any options, hold on the line and a nurse will answer the phone.    For prescription refill requests, have your pharmacy contact our office and allow 72 hours.

## 2023-07-31 ENCOUNTER — Inpatient Hospital Stay: Payer: No Typology Code available for payment source

## 2023-08-01 ENCOUNTER — Encounter: Payer: Self-pay | Admitting: Hematology

## 2023-08-01 ENCOUNTER — Other Ambulatory Visit: Payer: No Typology Code available for payment source

## 2023-08-01 ENCOUNTER — Inpatient Hospital Stay: Payer: BC Managed Care – PPO | Attending: Hematology

## 2023-08-01 DIAGNOSIS — D696 Thrombocytopenia, unspecified: Secondary | ICD-10-CM | POA: Insufficient documentation

## 2023-08-01 DIAGNOSIS — Z9884 Bariatric surgery status: Secondary | ICD-10-CM | POA: Diagnosis not present

## 2023-08-01 DIAGNOSIS — D509 Iron deficiency anemia, unspecified: Secondary | ICD-10-CM | POA: Insufficient documentation

## 2023-08-01 DIAGNOSIS — D72819 Decreased white blood cell count, unspecified: Secondary | ICD-10-CM

## 2023-08-01 DIAGNOSIS — E559 Vitamin D deficiency, unspecified: Secondary | ICD-10-CM | POA: Insufficient documentation

## 2023-08-01 DIAGNOSIS — E538 Deficiency of other specified B group vitamins: Secondary | ICD-10-CM | POA: Diagnosis present

## 2023-08-01 DIAGNOSIS — D500 Iron deficiency anemia secondary to blood loss (chronic): Secondary | ICD-10-CM

## 2023-08-01 DIAGNOSIS — K9089 Other intestinal malabsorption: Secondary | ICD-10-CM

## 2023-08-01 LAB — COMPREHENSIVE METABOLIC PANEL
ALT: 16 U/L (ref 0–44)
AST: 17 U/L (ref 15–41)
Albumin: 3.6 g/dL (ref 3.5–5.0)
Alkaline Phosphatase: 36 U/L — ABNORMAL LOW (ref 38–126)
Anion gap: 5 (ref 5–15)
BUN: 12 mg/dL (ref 6–20)
CO2: 25 mmol/L (ref 22–32)
Calcium: 8.9 mg/dL (ref 8.9–10.3)
Chloride: 107 mmol/L (ref 98–111)
Creatinine, Ser: 0.78 mg/dL (ref 0.44–1.00)
GFR, Estimated: >60 mL/min (ref >60)
Glucose, Bld: 84 mg/dL (ref 70–99)
Potassium: 3.8 mmol/L (ref 3.5–5.1)
Sodium: 137 mmol/L (ref 135–145)
Total Bilirubin: 0.4 mg/dL (ref 0.0–1.2)
Total Protein: 7.9 g/dL (ref 6.5–8.1)

## 2023-08-01 LAB — CBC WITH DIFFERENTIAL/PLATELET
Abs Immature Granulocytes: 0 10*3/uL (ref 0.00–0.07)
Basophils Absolute: 0 10*3/uL (ref 0.0–0.1)
Basophils Relative: 1 %
Eosinophils Absolute: 0 10*3/uL (ref 0.0–0.5)
Eosinophils Relative: 1 %
HCT: 43.6 % (ref 36.0–46.0)
Hemoglobin: 14.5 g/dL (ref 12.0–15.0)
Lymphocytes Relative: 32 %
Lymphs Abs: 1.2 10*3/uL (ref 0.7–4.0)
MCH: 30.4 pg (ref 26.0–34.0)
MCHC: 33.3 g/dL (ref 30.0–36.0)
MCV: 91.4 fL (ref 80.0–100.0)
Monocytes Absolute: 0.3 10*3/uL (ref 0.1–1.0)
Monocytes Relative: 8 %
Neutro Abs: 2.1 10*3/uL (ref 1.7–7.7)
Neutrophils Relative %: 58 %
Platelets: 184 10*3/uL (ref 150–400)
RBC: 4.77 MIL/uL (ref 3.87–5.11)
RDW: 13.1 % (ref 11.5–15.5)
WBC: 3.7 10*3/uL — ABNORMAL LOW (ref 4.0–10.5)
nRBC: 0 % (ref 0.0–0.2)

## 2023-08-01 LAB — IRON AND TIBC
Iron: 108 ug/dL (ref 28–170)
Saturation Ratios: 29 % (ref 10.4–31.8)
TIBC: 372 ug/dL (ref 250–450)
UIBC: 264 ug/dL

## 2023-08-01 LAB — IMMATURE PLATELET FRACTION: Immature Platelet Fraction: 7.4 % (ref 1.2–8.6)

## 2023-08-01 LAB — FERRITIN: Ferritin: 179 ng/mL (ref 11–307)

## 2023-08-01 LAB — VITAMIN D 25 HYDROXY (VIT D DEFICIENCY, FRACTURES): Vit D, 25-Hydroxy: 18.86 ng/mL — ABNORMAL LOW (ref 30–100)

## 2023-08-01 LAB — VITAMIN B12: Vitamin B-12: 222 pg/mL (ref 180–914)

## 2023-08-03 LAB — METHYLMALONIC ACID, SERUM: Methylmalonic Acid, Quantitative: 85 nmol/L (ref 0–378)

## 2023-08-07 ENCOUNTER — Other Ambulatory Visit: Payer: Self-pay | Admitting: *Deleted

## 2023-08-07 ENCOUNTER — Inpatient Hospital Stay: Payer: BC Managed Care – PPO | Admitting: Physician Assistant

## 2023-08-07 NOTE — Progress Notes (Addendum)
 VIRTUAL VISIT via TELEPHONE NOTE [facility]   I connected with Candice Ross  on 08/08/23 at  8:20 AM by telephone and verified that I am speaking with the correct person using two identifiers.  Location: Patient: Home Provider: [facility]   I discussed the limitations, risks, security and privacy concerns of performing an evaluation and management service by telephone and the availability of in person appointments. I also discussed with the patient that there may be a patient responsible charge related to this service. The patient expressed understanding and agreed to proceed.  REASON FOR VISIT:  Follow-up for iron  deficiency anemia and B12 deficiency   CURRENT THERAPY: Intermittent IV iron  infusions, vitamin B12 injections  INTERVAL HISTORY:  Candice Ross is contacted today for follow-up of her iron  deficiency anemia and vitamin B12 deficiency.  She was last seen by Candice Ross on 02/01/2023.     At today's visit, she reports feeling fair.  No recent hospitalizations, surgeries, or changes in baseline health status.   She continues to take lo-Loestrin for control of her menorrhagia, and reports improved menstrual bleeding for the past six months.   She has not had any rectal bleeding or melena.   She reports increased fatigue.  She denies any significant lightheadedness, ice pica, restless legs, headaches, chest pain, dyspnea on exertion, or syncope.  No B symptoms or frequent infections.  After last visit, she tried self administering B12 injections at home, but was unable to do this.  She has continued to take vitamin D  2000 units daily.   She has 25% energy and 100% appetite. She endorses that she is maintaining a stable weight.   ASSESSMENT & PLAN:  1.  Iron  deficiency anemia - Etiology is secondary to malabsorption (history of gastric bypass, chronic PPI therapy) and blood loss (history of anastomotic  ulcer, menorrhagia) - EGD (01/13/2014 by Dr. Harvey): Small anastomotic ulcer, mild gastritis, gastric polyps - Colonoscopy (01/13/2014): Redundant colon, moderate-sized external hemorrhoids - She failed to improve on oral iron  and experienced significant constipation. - Last IV iron  with Venofer  300 mg x 3 in January 2024 - No rectal bleeding or melena.  Experienced several months of menorrhagia in early 2024, which resolved after IUD was removed and she was placed on lo-Loestrin - Most recent labs (08/01/2023): Hgb 14.5/MCV 91.4, ferritin 179, iron  saturation 29%.  CMP normal.  Chronic leukopenia stable at baseline.  Previously noted thrombocytopenia has resolved (platelets 184), and immature platelet fraction was normal at 7.4% - PLAN: No indication for IV iron  at this time.  Recheck CBC with iron  panel in 6 months.     2.  Vitamin B12 deficiency - Likely secondary to malabsorption from gastric bypass surgery - Labs from December 2023 (07/06/2022): Vitamin B12 198 with MMA normal at 134 - Unable to tolerate oral supplementation, reports that it upsets her stomach. - Started vitamin B12 injections in December 2023 -- tried self-administering at home since July 2024, but this was unsuccessful - Most recent labs (08/01/2023): Vitamin B12 marginal at 222, normal MMA.  Normal folate when checked in July 2024. - PLAN: Restart monthly B12 injections to be given in clinic.  Will recheck B12/MMA in 6 months.   3.  Vitamin D  deficiency - Most recent labs (07/06/2022): Vitamin D  mildly low at 24.12 - She was previously taking weekly vitamin D  50,000 units, but this upset her stomach - She is taking vitamin D  2000 mcg  daily  - Most recent labs (08/01/2023): Vitamin D  low at 18.86 - PLAN: We will increase vitamin D  to 4000 units daily.  Recheck in 6 months.   4.  Leukopenia - Review of past labs shows the patient has chronic leukopenia/low-normal WBCs since at least 2014, with WBC ranging from 2.9 to 5.2.   Lowest ANC was 1.3 (09/26/2017) - Complete neutropenia work-up on patient.  Hepatitis B, hepatitis C, HIV were negative.  ANA was negative.  Flow cytometry was negative for any monoclonal B-cell population. 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 Negative for chills, diaphoresis, fever and weight loss.  Respiratory:  Negative for cough and shortness of breath.   Cardiovascular:  Negative for chest pain and palpitations.  Gastrointestinal:  Negative for abdominal pain, blood in stool, melena, nausea and vomiting.  Neurological:  Negative for dizziness and headaches.     PHYSICAL EXAM: (per limitations of virtual telephone visit)  The patient is alert and oriented x 3, exhibiting adequate mentation, good mood, and ability to speak in full sentences and execute sound judgement.  WRAP UP:   I discussed the assessment and treatment plan with the patient. The patient was provided an opportunity to ask questions and all were answered. The patient agreed with the plan and demonstrated an understanding of the instructions.   The patient was advised to call back or seek an in-person evaluation if the symptoms worsen or if the condition fails to improve as anticipated.  I provided 22 minutes of non-face-to-face time during this encounter, with >10 minutes of medical discussion.  Candice [name], Ross 08/08/2023 8:35 AM

## 2023-08-08 ENCOUNTER — Telehealth: Payer: No Typology Code available for payment source | Admitting: Physician Assistant

## 2023-08-08 ENCOUNTER — Inpatient Hospital Stay: Payer: BC Managed Care – PPO | Admitting: Physician Assistant

## 2023-08-08 ENCOUNTER — Encounter: Payer: Self-pay | Admitting: Hematology

## 2023-08-08 ENCOUNTER — Encounter: Payer: Self-pay | Admitting: Physician Assistant

## 2023-08-08 VITALS — Wt 147.0 lb

## 2023-08-08 DIAGNOSIS — D500 Iron deficiency anemia secondary to blood loss (chronic): Secondary | ICD-10-CM | POA: Diagnosis not present

## 2023-08-08 DIAGNOSIS — Z9884 Bariatric surgery status: Secondary | ICD-10-CM | POA: Diagnosis not present

## 2023-08-08 DIAGNOSIS — E559 Vitamin D deficiency, unspecified: Secondary | ICD-10-CM | POA: Diagnosis not present

## 2023-08-08 DIAGNOSIS — D72819 Decreased white blood cell count, unspecified: Secondary | ICD-10-CM

## 2023-08-08 DIAGNOSIS — E538 Deficiency of other specified B group vitamins: Secondary | ICD-10-CM | POA: Diagnosis not present

## 2023-08-08 MED ORDER — CHOLECALCIFEROL 100 MCG (4000 UT) PO CAPS: 1.0000 | ORAL_CAPSULE | Freq: Every day | ORAL | 3 refills | Status: DC

## 2023-08-10 ENCOUNTER — Inpatient Hospital Stay: Payer: BC Managed Care – PPO

## 2023-08-10 VITALS — BP 113/75 | HR 74 | Temp 97.6°F | Resp 16

## 2023-08-10 DIAGNOSIS — D500 Iron deficiency anemia secondary to blood loss (chronic): Secondary | ICD-10-CM

## 2023-08-10 DIAGNOSIS — E538 Deficiency of other specified B group vitamins: Secondary | ICD-10-CM | POA: Diagnosis not present

## 2023-08-10 DIAGNOSIS — Z9884 Bariatric surgery status: Secondary | ICD-10-CM

## 2023-08-10 MED ORDER — CYANOCOBALAMIN 1000 MCG/ML IJ SOLN
1000.0000 ug | Freq: Once | INTRAMUSCULAR | Status: AC
Start: 2023-08-10 — End: 2023-08-10
  Administered 2023-08-10: 1000 ug via INTRAMUSCULAR
  Filled 2023-08-10: qty 1

## 2023-08-10 NOTE — Patient Instructions (Signed)
 [facility] [place] [facility]  Discharge Instructions: Thank you for choosing [facility] to provide your oncology and hematology care.  If you have a lab appointment with the Cancer Center - please note that after April 8th, 2024, all labs will be drawn in the cancer center.  You do not have to check in or register with the main entrance as you have in the past but will complete your check-in in the cancer center.  Wear comfortable clothing and clothing appropriate for easy access to any Portacath or PICC line.   We strive to give you quality time with your provider. You may need to reschedule your appointment if you arrive late (15 or more minutes).  Arriving late affects you and other patients whose appointments are after yours.  Also, if you miss three or more appointments without notifying the office, you may be dismissed from the clinic at the provider's discretion.      For prescription refill requests, have your pharmacy contact our office and allow 72 hours for refills to be completed.    Today you received the following: B12 injection   To help prevent nausea and vomiting after your treatment, we encourage you to take your nausea medication as directed.  BELOW ARE SYMPTOMS THAT SHOULD BE REPORTED IMMEDIATELY: *FEVER GREATER THAN 100.4 F (38 C) OR HIGHER *CHILLS OR SWEATING *NAUSEA AND VOMITING THAT IS NOT CONTROLLED WITH YOUR NAUSEA MEDICATION *UNUSUAL SHORTNESS OF BREATH *UNUSUAL BRUISING OR BLEEDING *URINARY PROBLEMS (pain or burning when urinating, or frequent urination) *BOWEL PROBLEMS (unusual diarrhea, constipation, pain near the anus) TENDERNESS IN MOUTH AND THROAT WITH OR WITHOUT PRESENCE OF ULCERS (sore throat, sores in mouth, or a toothache) UNUSUAL RASH, SWELLING OR PAIN  UNUSUAL VAGINAL DISCHARGE OR ITCHING   Items with * indicate a potential emergency and should be followed up as soon as possible or go to the  Emergency Department if any problems should occur.  Please show the CHEMOTHERAPY ALERT CARD or IMMUNOTHERAPY ALERT CARD at check-in to the Emergency Department and triage nurse.  Should you have questions after your visit or need to cancel or reschedule your appointment, please contact [facility] CANCER CTR [place] - A DEPT OF Eligha Bridegroom [facility] [phone]  and follow the prompts.  Office hours are 8:00 a.m. to 4:30 p.m. Monday - Friday. Please note that voicemails left after 4:00 p.m. may not be returned until the following business day.  We are closed weekends and major holidays. You have access to a nurse at all times for urgent questions. Please call the main number to the clinic [phone] and follow the prompts.  For any non-urgent questions, you may also contact your provider using MyChart. We now offer e-Visits for anyone 44 and older to request care online for non-urgent symptoms. For details visit [email or web address].   Also download the MyChart app! Go to the app store, search "MyChart", open the app, select [place], and log in with your MyChart username and password.

## 2023-08-10 NOTE — Progress Notes (Signed)
Patient tolerated Vitamin B 12 injection with no complaints voiced.  Site clean and dry with no bruising or swelling noted.  No complaints of pain.  Discharged with vital signs stable and no signs or symptoms of distress noted.   ?

## 2023-09-11 ENCOUNTER — Inpatient Hospital Stay: Payer: BC Managed Care – PPO | Attending: Hematology

## 2023-09-11 VITALS — BP 122/71 | HR 70 | Temp 97.7°F | Resp 16

## 2023-09-11 DIAGNOSIS — Z9884 Bariatric surgery status: Secondary | ICD-10-CM

## 2023-09-11 DIAGNOSIS — E538 Deficiency of other specified B group vitamins: Secondary | ICD-10-CM | POA: Diagnosis present

## 2023-09-11 DIAGNOSIS — D500 Iron deficiency anemia secondary to blood loss (chronic): Secondary | ICD-10-CM

## 2023-09-11 MED ORDER — CYANOCOBALAMIN 1000 MCG/ML IJ SOLN
1000.0000 ug | Freq: Once | INTRAMUSCULAR | Status: AC
  Administered 2023-09-11: 1000 ug via INTRAMUSCULAR
  Filled 2023-09-11: qty 1

## 2023-09-11 NOTE — Patient Instructions (Signed)
 [facility] [place] [facility]  Discharge Instructions: Thank you for choosing [facility] to provide your oncology and hematology care.  If you have a lab appointment with the Cancer Center - please note that after April 8th, 2024, all labs will be drawn in the cancer center.  You do not have to check in or register with the main entrance as you have in the past but will complete your check-in in the cancer center.  Wear comfortable clothing and clothing appropriate for easy access to any Portacath or PICC line.   We strive to give you quality time with your provider. You may need to reschedule your appointment if you arrive late (15 or more minutes).  Arriving late affects you and other patients whose appointments are after yours.  Also, if you miss three or more appointments without notifying the office, you may be dismissed from the clinic at the provider's discretion.      For prescription refill requests, have your pharmacy contact our office and allow 72 hours for refills to be completed.    Today you received the following B12 injection.   Vitamin B12 Injection What is this medication? Vitamin B12 (VAHY tuh min B12) prevents and treats low vitamin B12 levels in your body. It is used in people who do not get enough vitamin B12 from their diet or when their digestive tract does not absorb enough. Vitamin B12 plays an important role in maintaining the health of your nervous system and red blood cells. This medicine may be used for other purposes; ask your health care provider or pharmacist if you have questions. COMMON BRAND NAME(S): B-12 Compliance Kit, B-12 Injection Kit, Cyomin, Dodex, LA-12, Nutri-Twelve, Physicians EZ Use B-12, Primabalt, Vitamin Deficiency Injectable System - B12 What should I tell my care team before I take this medication? They need to know if you have any of these conditions: Kidney disease Leber's  disease Megaloblastic anemia An unusual or allergic reaction to cyanocobalamin, cobalt, other medications, foods, dyes, or preservatives Pregnant or trying to get pregnant Breast-feeding How should I use this medication? This medication is injected into a muscle or deeply under the skin. It is usually given in a clinic or care team's office. However, your care team may teach you how to inject yourself. Follow all instructions. Talk to your care team about the use of this medication in children. Special care may be needed. Overdosage: If you think you have taken too much of this medicine contact a poison control center or emergency room at once. NOTE: This medicine is only for you. Do not share this medicine with others. What if I miss a dose? If you are given your dose at a clinic or care team's office, call to reschedule your appointment. If you give your own injections, and you miss a dose, take it as soon as you can. If it is almost time for your next dose, take only that dose. Do not take double or extra doses. What may interact with this medication? Alcohol Colchicine This list may not describe all possible interactions. Give your health care provider a list of all the medicines, herbs, non-prescription drugs, or dietary supplements you use. Also tell them if you smoke, drink alcohol, or use illegal drugs. Some items may interact with your medicine. What should I watch for while using this medication? Visit your care team regularly. You may need blood work done while you are  taking this medication. You may need to follow a special diet. Talk to your care team. Limit your alcohol intake and avoid smoking to get the best benefit. What side effects may I notice from receiving this medication? Side effects that you should report to your care team as soon as possible: Allergic reactions--skin rash, itching, hives, swelling of the face, lips, tongue, or throat Swelling of the ankles, hands, or  feet Trouble breathing Side effects that usually do not require medical attention (report to your care team if they continue or are bothersome): Diarrhea This list may not describe all possible side effects. Call your doctor for medical advice about side effects. You may report side effects to FDA at 1-800-FDA-1088. Where should I keep my medication? Keep out of the reach of children. Store at room temperature between 15 and 30 degrees C (59 and 85 degrees F). Protect from light. Throw away any unused medication after the expiration date. NOTE: This sheet is a summary. It may not cover all possible information. If you have questions about this medicine, talk to your doctor, pharmacist, or health care provider.  2024 Elsevier/Gold Standard (2021-03-23 00:00:00)    To help prevent nausea and vomiting after your treatment, we encourage you to take your nausea medication as directed.  BELOW ARE SYMPTOMS THAT SHOULD BE REPORTED IMMEDIATELY: *FEVER GREATER THAN 100.4 F (38 C) OR HIGHER *CHILLS OR SWEATING *NAUSEA AND VOMITING THAT IS NOT CONTROLLED WITH YOUR NAUSEA MEDICATION *UNUSUAL SHORTNESS OF BREATH *UNUSUAL BRUISING OR BLEEDING *URINARY PROBLEMS (pain or burning when urinating, or frequent urination) *BOWEL PROBLEMS (unusual diarrhea, constipation, pain near the anus) TENDERNESS IN MOUTH AND THROAT WITH OR WITHOUT PRESENCE OF ULCERS (sore throat, sores in mouth, or a toothache) UNUSUAL RASH, SWELLING OR PAIN  UNUSUAL VAGINAL DISCHARGE OR ITCHING   Items with * indicate a potential emergency and should be followed up as soon as possible or go to the Emergency Department if any problems should occur.  Please show the CHEMOTHERAPY ALERT CARD or IMMUNOTHERAPY ALERT CARD at check-in to the Emergency Department and triage nurse.  Should you have questions after your visit or need to cancel or reschedule your appointment, please contact [facility] CANCER CTR [place] - A DEPT OF [facility] Bridegroom [facility] [phone]  and follow the prompts.  Office hours are 8:00 a.m. to 4:30 p.m. Monday - Friday. Please note that voicemails left after 4:00 p.m. may not be returned until the following business day.  We are closed weekends and major holidays. You have access to a nurse at all times for urgent questions. Please call the main number to the clinic [phone] and follow the prompts.  For any non-urgent questions, you may also contact your provider using MyChart. We now offer e-Visits for anyone 23 and older to request care online for non-urgent symptoms. For details visit [email or web address].   Also download the MyChart app! Go to the app store, search "MyChart", open the app, select [place], and log in with your MyChart username and password.

## 2023-09-11 NOTE — Progress Notes (Signed)
 Patient presents today for B12 injection. Patient tolerated injection in left deltoid with no complaints voiced.  Site clean and dry with no bruising or swelling noted.  No complaints of pain.  Discharged with vital signs stable and no signs or symptoms of distress noted.

## 2023-10-09 ENCOUNTER — Inpatient Hospital Stay: Payer: BC Managed Care – PPO

## 2023-10-10 ENCOUNTER — Inpatient Hospital Stay: Attending: Hematology

## 2023-10-10 VITALS — BP 111/78 | HR 76 | Temp 97.5°F | Resp 17

## 2023-10-10 DIAGNOSIS — E538 Deficiency of other specified B group vitamins: Secondary | ICD-10-CM | POA: Diagnosis present

## 2023-10-10 DIAGNOSIS — D500 Iron deficiency anemia secondary to blood loss (chronic): Secondary | ICD-10-CM

## 2023-10-10 DIAGNOSIS — Z9884 Bariatric surgery status: Secondary | ICD-10-CM

## 2023-10-10 MED ORDER — CYANOCOBALAMIN 1000 MCG/ML IJ SOLN
1000.0000 ug | Freq: Once | INTRAMUSCULAR | Status: AC
  Administered 2023-10-10: 1000 ug via INTRAMUSCULAR
  Filled 2023-10-10: qty 1

## 2023-10-10 NOTE — Progress Notes (Signed)
 Candice Ross presents today for injection per the provider's orders.  B12 administration without incident; injection site WNL; see MAR for injection details.  Patient tolerated procedure well and without incident.  No questions or complaints noted at this time. Discharged from clinic ambulatory in stable condition. Alert and oriented x 3. F/U with [facility] as scheduled.

## 2023-10-10 NOTE — Patient Instructions (Signed)
 [facility] [place] [facility]  Discharge Instructions: Thank you for choosing [facility] to provide your oncology and hematology care.  If you have a lab appointment with the Cancer Center - please note that after April 8th, 2024, all labs will be drawn in the cancer center.  You do not have to check in or register with the main entrance as you have in the past but will complete your check-in in the cancer center.  Wear comfortable clothing and clothing appropriate for easy access to any Portacath or PICC line.   We strive to give you quality time with your provider. You may need to reschedule your appointment if you arrive late (15 or more minutes).  Arriving late affects you and other patients whose appointments are after yours.  Also, if you miss three or more appointments without notifying the office, you may be dismissed from the clinic at the provider's discretion.      For prescription refill requests, have your pharmacy contact our office and allow 72 hours for refills to be completed.    Today you received the following chemotherapy and/or immunotherapy agents vitamin B12 Vitamin B12 Injection What is this medication? Vitamin B12 (VAHY tuh min B12) prevents and treats low vitamin B12 levels in your body. It is used in people who do not get enough vitamin B12 from their diet or when their digestive tract does not absorb enough. Vitamin B12 plays an important role in maintaining the health of your nervous system and red blood cells. This medicine may be used for other purposes; ask your health care provider or pharmacist if you have questions. COMMON BRAND NAME(S): B-12 Compliance Kit, B-12 Injection Kit, Cyomin, Dodex, LA-12, Nutri-Twelve, Physicians EZ Use B-12, Primabalt, Vitamin Deficiency Injectable System - B12 What should I tell my care team before I take this medication? They need to know if you have any of these  conditions: Kidney disease Leber's disease Megaloblastic anemia An unusual or allergic reaction to cyanocobalamin, cobalt, other medications, foods, dyes, or preservatives Pregnant or trying to get pregnant Breast-feeding How should I use this medication? This medication is injected into a muscle or deeply under the skin. It is usually given in a clinic or care team's office. However, your care team may teach you how to inject yourself. Follow all instructions. Talk to your care team about the use of this medication in children. Special care may be needed. Overdosage: If you think you have taken too much of this medicine contact a poison control center or emergency room at once. NOTE: This medicine is only for you. Do not share this medicine with others. What if I miss a dose? If you are given your dose at a clinic or care team's office, call to reschedule your appointment. If you give your own injections, and you miss a dose, take it as soon as you can. If it is almost time for your next dose, take only that dose. Do not take double or extra doses. What may interact with this medication? Alcohol Colchicine This list may not describe all possible interactions. Give your health care provider a list of all the medicines, herbs, non-prescription drugs, or dietary supplements you use. Also tell them if you smoke, drink alcohol, or use illegal drugs. Some items may interact with your medicine. What should I watch for while using this medication? Visit your care team regularly. You may need blood work done while  you are taking this medication. You may need to follow a special diet. Talk to your care team. Limit your alcohol intake and avoid smoking to get the best benefit. What side effects may I notice from receiving this medication? Side effects that you should report to your care team as soon as possible: Allergic reactions--skin rash, itching, hives, swelling of the face, lips, tongue, or  throat Swelling of the ankles, hands, or feet Trouble breathing Side effects that usually do not require medical attention (report to your care team if they continue or are bothersome): Diarrhea This list may not describe all possible side effects. Call your doctor for medical advice about side effects. You may report side effects to FDA at 1-800-FDA-1088. Where should I keep my medication? Keep out of the reach of children. Store at room temperature between 15 and 30 degrees C (59 and 85 degrees F). Protect from light. Throw away any unused medication after the expiration date. NOTE: This sheet is a summary. It may not cover all possible information. If you have questions about this medicine, talk to your doctor, pharmacist, or health care provider.  2024 Elsevier/Gold Standard (2021-03-23 00:00:00)      To help prevent nausea and vomiting after your treatment, we encourage you to take your nausea medication as directed.  BELOW ARE SYMPTOMS THAT SHOULD BE REPORTED IMMEDIATELY: *FEVER GREATER THAN 100.4 F (38 C) OR HIGHER *CHILLS OR SWEATING *NAUSEA AND VOMITING THAT IS NOT CONTROLLED WITH YOUR NAUSEA MEDICATION *UNUSUAL SHORTNESS OF BREATH *UNUSUAL BRUISING OR BLEEDING *URINARY PROBLEMS (pain or burning when urinating, or frequent urination) *BOWEL PROBLEMS (unusual diarrhea, constipation, pain near the anus) TENDERNESS IN MOUTH AND THROAT WITH OR WITHOUT PRESENCE OF ULCERS (sore throat, sores in mouth, or a toothache) UNUSUAL RASH, SWELLING OR PAIN  UNUSUAL VAGINAL DISCHARGE OR ITCHING   Items with * indicate a potential emergency and should be followed up as soon as possible or go to the Emergency Department if any problems should occur.  Please show the CHEMOTHERAPY ALERT CARD or IMMUNOTHERAPY ALERT CARD at check-in to the Emergency Department and triage nurse.  Should you have questions after your visit or need to cancel or reschedule your appointment, please contact [facility] CANCER  CTR [place] - A DEPT OF Eligha Bridegroom [facility] [phone]  and follow the prompts.  Office hours are 8:00 a.m. to 4:30 p.m. Monday - Friday. Please note that voicemails left after 4:00 p.m. may not be returned until the following business day.  We are closed weekends and major holidays. You have access to a nurse at all times for urgent questions. Please call the main number to the clinic [phone] and follow the prompts.  For any non-urgent questions, you may also contact your provider using MyChart. We now offer e-Visits for anyone 30 and older to request care online for non-urgent symptoms. For details visit [email or web address].   Also download the MyChart app! Go to the app store, search "MyChart", open the app, select [place], and log in with your MyChart username and password.

## 2023-11-09 ENCOUNTER — Inpatient Hospital Stay: Payer: BC Managed Care – PPO | Attending: Hematology

## 2023-12-08 ENCOUNTER — Inpatient Hospital Stay: Payer: BC Managed Care – PPO | Attending: Hematology

## 2023-12-08 VITALS — BP 112/69 | HR 82 | Temp 97.9°F | Resp 19

## 2023-12-08 DIAGNOSIS — D500 Iron deficiency anemia secondary to blood loss (chronic): Secondary | ICD-10-CM

## 2023-12-08 DIAGNOSIS — E538 Deficiency of other specified B group vitamins: Secondary | ICD-10-CM | POA: Diagnosis present

## 2023-12-08 DIAGNOSIS — Z9884 Bariatric surgery status: Secondary | ICD-10-CM

## 2023-12-08 MED ORDER — CYANOCOBALAMIN 1000 MCG/ML IJ SOLN
1000.0000 ug | Freq: Once | INTRAMUSCULAR | Status: AC
Start: 2023-12-08 — End: 2023-12-08
  Administered 2023-12-08: 1000 ug via INTRAMUSCULAR
  Filled 2023-12-08: qty 1

## 2023-12-08 NOTE — Patient Instructions (Signed)
 [facility] [place] [facility]  Discharge Instructions: Thank you for choosing [facility] to provide your oncology and hematology care.  If you have a lab appointment with the Cancer Center - please note that after April 8th, 2024, all labs will be drawn in the cancer center.  You do not have to check in or register with the main entrance as you have in the past but will complete your check-in in the cancer center.  Wear comfortable clothing and clothing appropriate for easy access to any Portacath or PICC line.   We strive to give you quality time with your provider. You may need to reschedule your appointment if you arrive late (15 or more minutes).  Arriving late affects you and other patients whose appointments are after yours.  Also, if you miss three or more appointments without notifying the office, you may be dismissed from the clinic at the provider's discretion.      For prescription refill requests, have your pharmacy contact our office and allow 72 hours for refills to be completed.    Today you received the following: Vitamin B12   Vitamin B12 Injection What is this medication? Vitamin B12 (VAHY tuh min B12) prevents and treats low vitamin B12 levels in your body. It is used in people who do not get enough vitamin B12 from their diet or when their digestive tract does not absorb enough. Vitamin B12 plays an important role in maintaining the health of your nervous system and red blood cells. This medicine may be used for other purposes; ask your health care provider or pharmacist if you have questions. COMMON BRAND NAME(S): B-12 Compliance Kit, B-12 Injection Kit, Cyomin, Dodex, LA-12, Nutri-Twelve, Physicians EZ Use B-12, Primabalt, Vitamin Deficiency Injectable System - B12 What should I tell my care team before I take this medication? They need to know if you have any of these conditions: Kidney disease Leber's  disease Megaloblastic anemia An unusual or allergic reaction to cyanocobalamin, cobalt, other medications, foods, dyes, or preservatives Pregnant or trying to get pregnant Breast-feeding How should I use this medication? This medication is injected into a muscle or deeply under the skin. It is usually given in a clinic or care team's office. However, your care team may teach you how to inject yourself. Follow all instructions. Talk to your care team about the use of this medication in children. Special care may be needed. Overdosage: If you think you have taken too much of this medicine contact a poison control center or emergency room at once. NOTE: This medicine is only for you. Do not share this medicine with others. What if I miss a dose? If you are given your dose at a clinic or care team's office, call to reschedule your appointment. If you give your own injections, and you miss a dose, take it as soon as you can. If it is almost time for your next dose, take only that dose. Do not take double or extra doses. What may interact with this medication? Alcohol Colchicine This list may not describe all possible interactions. Give your health care provider a list of all the medicines, herbs, non-prescription drugs, or dietary supplements you use. Also tell them if you smoke, drink alcohol, or use illegal drugs. Some items may interact with your medicine. What should I watch for while using this medication? Visit your care team regularly. You may need blood work done while you are  taking this medication. You may need to follow a special diet. Talk to your care team. Limit your alcohol intake and avoid smoking to get the best benefit. What side effects may I notice from receiving this medication? Side effects that you should report to your care team as soon as possible: Allergic reactions--skin rash, itching, hives, swelling of the face, lips, tongue, or throat Swelling of the ankles, hands, or  feet Trouble breathing Side effects that usually do not require medical attention (report to your care team if they continue or are bothersome): Diarrhea This list may not describe all possible side effects. Call your doctor for medical advice about side effects. You may report side effects to FDA at 1-800-FDA-1088. Where should I keep my medication? Keep out of the reach of children. Store at room temperature between 15 and 30 degrees C (59 and 85 degrees F). Protect from light. Throw away any unused medication after the expiration date. NOTE: This sheet is a summary. It may not cover all possible information. If you have questions about this medicine, talk to your doctor, pharmacist, or health care provider.  2024 Elsevier/Gold Standard (2021-03-23 00:00:00)    To help prevent nausea and vomiting after your treatment, we encourage you to take your nausea medication as directed.  BELOW ARE SYMPTOMS THAT SHOULD BE REPORTED IMMEDIATELY: *FEVER GREATER THAN 100.4 F (38 C) OR HIGHER *CHILLS OR SWEATING *NAUSEA AND VOMITING THAT IS NOT CONTROLLED WITH YOUR NAUSEA MEDICATION *UNUSUAL SHORTNESS OF BREATH *UNUSUAL BRUISING OR BLEEDING *URINARY PROBLEMS (pain or burning when urinating, or frequent urination) *BOWEL PROBLEMS (unusual diarrhea, constipation, pain near the anus) TENDERNESS IN MOUTH AND THROAT WITH OR WITHOUT PRESENCE OF ULCERS (sore throat, sores in mouth, or a toothache) UNUSUAL RASH, SWELLING OR PAIN  UNUSUAL VAGINAL DISCHARGE OR ITCHING   Items with * indicate a potential emergency and should be followed up as soon as possible or go to the Emergency Department if any problems should occur.  Please show the CHEMOTHERAPY ALERT CARD or IMMUNOTHERAPY ALERT CARD at check-in to the Emergency Department and triage nurse.  Should you have questions after your visit or need to cancel or reschedule your appointment, please contact [facility] CANCER CTR [place] - A DEPT OF Eligha Bridegroom [facility] [phone]  and follow the prompts.  Office hours are 8:00 a.m. to 4:30 p.m. Monday - Friday. Please note that voicemails left after 4:00 p.m. may not be returned until the following business day.  We are closed weekends and major holidays. You have access to a nurse at all times for urgent questions. Please call the main number to the clinic [phone] and follow the prompts.  For any non-urgent questions, you may also contact your provider using MyChart. We now offer e-Visits for anyone 58 and older to request care online for non-urgent symptoms. For details visit [email or web address].   Also download the MyChart app! Go to the app store, search "MyChart", open the app, select [place], and log in with your MyChart username and password.

## 2023-12-08 NOTE — Progress Notes (Signed)
 Patient tolerated Vitamin B 12 injection with no complaints voiced.  Site clean and dry with no bruising or swelling noted.  No complaints of pain.  Discharged with vital signs stable and no signs or symptoms of distress noted.   ?

## 2023-12-28 ENCOUNTER — Encounter (INDEPENDENT_AMBULATORY_CARE_PROVIDER_SITE_OTHER): Payer: Self-pay | Admitting: *Deleted

## 2024-01-08 ENCOUNTER — Inpatient Hospital Stay: Payer: BC Managed Care – PPO | Attending: Hematology

## 2024-01-08 VITALS — BP 103/71 | HR 78 | Resp 18

## 2024-01-08 DIAGNOSIS — E538 Deficiency of other specified B group vitamins: Secondary | ICD-10-CM | POA: Insufficient documentation

## 2024-01-08 DIAGNOSIS — Z9884 Bariatric surgery status: Secondary | ICD-10-CM

## 2024-01-08 DIAGNOSIS — D500 Iron deficiency anemia secondary to blood loss (chronic): Secondary | ICD-10-CM

## 2024-01-08 MED ORDER — CYANOCOBALAMIN 1000 MCG/ML IJ SOLN
1000.0000 ug | Freq: Once | INTRAMUSCULAR | Status: AC
  Administered 2024-01-08: 1000 ug via INTRAMUSCULAR
  Filled 2024-01-08: qty 1

## 2024-01-08 NOTE — Progress Notes (Signed)
 Candice Ross presents today for injection per the provider's orders.  B12 administration without incident; injection site WNL; see MAR for injection details.  Patient tolerated procedure well and without incident.  No questions or complaints noted at this time. Discharged from clinic ambulatory in stable condition. Alert and oriented x 3. F/U with [facility] as scheduled.

## 2024-01-08 NOTE — Patient Instructions (Signed)
 [facility] [place] - A DEPT OF [facility]  Discharge Instructions: Thank you for choosing [facility] to provide your oncology and hematology care.  If you have a lab appointment with the Cancer Center - please note that after April 8th, 2024, all labs will be drawn in the cancer center.  You do not have to check in or register with the main entrance as you have in the past but will complete your check-in in the cancer center.  Wear comfortable clothing and clothing appropriate for easy access to any Portacath or PICC line.   We strive to give you quality time with your provider. You may need to reschedule your appointment if you arrive late (15 or more minutes).  Arriving late affects you and other patients whose appointments are after yours.  Also, if you miss three or more appointments without notifying the office, you may be dismissed from the clinic at the provider's discretion.      For prescription refill requests, have your pharmacy contact our office and allow 72 hours for refills to be completed.    Today you received B12 injection     BELOW ARE SYMPTOMS THAT SHOULD BE REPORTED IMMEDIATELY: *FEVER GREATER THAN 100.4 F (38 C) OR HIGHER *CHILLS OR SWEATING *NAUSEA AND VOMITING THAT IS NOT CONTROLLED WITH YOUR NAUSEA MEDICATION *UNUSUAL SHORTNESS OF BREATH *UNUSUAL BRUISING OR BLEEDING *URINARY PROBLEMS (pain or burning when urinating, or frequent urination) *BOWEL PROBLEMS (unusual diarrhea, constipation, pain near the anus) TENDERNESS IN MOUTH AND THROAT WITH OR WITHOUT PRESENCE OF ULCERS (sore throat, sores in mouth, or a toothache) UNUSUAL RASH, SWELLING OR PAIN  UNUSUAL VAGINAL DISCHARGE OR ITCHING   Items with * indicate a potential emergency and should be followed up as soon as possible or go to the Emergency Department if any problems should occur.  Please show the CHEMOTHERAPY ALERT CARD or IMMUNOTHERAPY ALERT CARD at check-in to  the Emergency Department and triage nurse.  Should you have questions after your visit or need to cancel or reschedule your appointment, please contact [facility] [place] - A DEPT OF Eligha Bridegroom [facility] [phone]  and follow the prompts.  Office hours are 8:00 a.m. to 4:30 p.m. Monday - Friday. Please note that voicemails left after 4:00 p.m. may not be returned until the following business day.  We are closed weekends and major holidays. You have access to a nurse at all times for urgent questions. Please call the main number to the clinic [phone] and follow the prompts.  For any non-urgent questions, you may also contact your provider using MyChart. We now offer e-Visits for anyone 57 and older to request care online for non-urgent symptoms. For details visit [email or web address].   Also download the MyChart app! Go to the app store, search "MyChart", open the app, select [place], and log in with your MyChart username and password.

## 2024-01-31 ENCOUNTER — Inpatient Hospital Stay: Payer: BC Managed Care – PPO | Attending: Hematology

## 2024-01-31 DIAGNOSIS — E559 Vitamin D deficiency, unspecified: Secondary | ICD-10-CM | POA: Insufficient documentation

## 2024-01-31 DIAGNOSIS — D508 Other iron deficiency anemias: Secondary | ICD-10-CM | POA: Insufficient documentation

## 2024-01-31 DIAGNOSIS — K912 Postsurgical malabsorption, not elsewhere classified: Secondary | ICD-10-CM | POA: Insufficient documentation

## 2024-01-31 DIAGNOSIS — Z9884 Bariatric surgery status: Secondary | ICD-10-CM | POA: Insufficient documentation

## 2024-01-31 DIAGNOSIS — E538 Deficiency of other specified B group vitamins: Secondary | ICD-10-CM | POA: Insufficient documentation

## 2024-02-06 NOTE — Progress Notes (Deleted)
 Candice Ross

## 2024-02-07 ENCOUNTER — Inpatient Hospital Stay: Payer: BC Managed Care – PPO

## 2024-02-07 ENCOUNTER — Inpatient Hospital Stay: Payer: BC Managed Care – PPO | Admitting: Physician Assistant

## 2024-02-07 ENCOUNTER — Inpatient Hospital Stay

## 2024-02-07 VITALS — BP 112/69 | HR 87 | Temp 98.0°F | Resp 18

## 2024-02-07 DIAGNOSIS — E559 Vitamin D deficiency, unspecified: Secondary | ICD-10-CM | POA: Diagnosis not present

## 2024-02-07 DIAGNOSIS — E538 Deficiency of other specified B group vitamins: Secondary | ICD-10-CM | POA: Diagnosis present

## 2024-02-07 DIAGNOSIS — D500 Iron deficiency anemia secondary to blood loss (chronic): Secondary | ICD-10-CM

## 2024-02-07 DIAGNOSIS — Z9884 Bariatric surgery status: Secondary | ICD-10-CM

## 2024-02-07 DIAGNOSIS — K912 Postsurgical malabsorption, not elsewhere classified: Secondary | ICD-10-CM | POA: Diagnosis present

## 2024-02-07 DIAGNOSIS — D508 Other iron deficiency anemias: Secondary | ICD-10-CM | POA: Diagnosis present

## 2024-02-07 DIAGNOSIS — D72819 Decreased white blood cell count, unspecified: Secondary | ICD-10-CM

## 2024-02-07 LAB — CBC WITH DIFFERENTIAL/PLATELET
Abs Immature Granulocytes: 0.01 K/uL (ref 0.00–0.07)
Basophils Absolute: 0 K/uL (ref 0.0–0.1)
Basophils Relative: 0 %
Eosinophils Absolute: 0 K/uL (ref 0.0–0.5)
Eosinophils Relative: 0 %
HCT: 41 % (ref 36.0–46.0)
Hemoglobin: 13.3 g/dL (ref 12.0–15.0)
Immature Granulocytes: 0 %
Lymphocytes Relative: 31 %
Lymphs Abs: 1.5 K/uL (ref 0.7–4.0)
MCH: 30.3 pg (ref 26.0–34.0)
MCHC: 32.4 g/dL (ref 30.0–36.0)
MCV: 93.4 fL (ref 80.0–100.0)
Monocytes Absolute: 0.4 K/uL (ref 0.1–1.0)
Monocytes Relative: 8 %
Neutro Abs: 2.9 K/uL (ref 1.7–7.7)
Neutrophils Relative %: 61 %
Platelets: 194 K/uL (ref 150–400)
RBC: 4.39 MIL/uL (ref 3.87–5.11)
RDW: 13.3 % (ref 11.5–15.5)
WBC: 4.9 K/uL (ref 4.0–10.5)
nRBC: 0 % (ref 0.0–0.2)

## 2024-02-07 LAB — IRON AND TIBC
Iron: 60 ug/dL (ref 28–170)
Saturation Ratios: 17 % (ref 10.4–31.8)
TIBC: 361 ug/dL (ref 250–450)
UIBC: 301 ug/dL

## 2024-02-07 LAB — COMPREHENSIVE METABOLIC PANEL WITH GFR
ALT: 13 U/L (ref 0–44)
AST: 19 U/L (ref 15–41)
Albumin: 3.3 g/dL — ABNORMAL LOW (ref 3.5–5.0)
Alkaline Phosphatase: 29 U/L — ABNORMAL LOW (ref 38–126)
Anion gap: 8 (ref 5–15)
BUN: 15 mg/dL (ref 6–20)
CO2: 24 mmol/L (ref 22–32)
Calcium: 8.9 mg/dL (ref 8.9–10.3)
Chloride: 107 mmol/L (ref 98–111)
Creatinine, Ser: 0.88 mg/dL (ref 0.44–1.00)
GFR, Estimated: >60 mL/min (ref >60)
Glucose, Bld: 94 mg/dL (ref 70–99)
Potassium: 3.9 mmol/L (ref 3.5–5.1)
Sodium: 139 mmol/L (ref 135–145)
Total Bilirubin: 0.6 mg/dL (ref 0.0–1.2)
Total Protein: 7.1 g/dL (ref 6.5–8.1)

## 2024-02-07 LAB — VITAMIN B12: Vitamin B-12: 281 pg/mL (ref 180–914)

## 2024-02-07 LAB — VITAMIN D 25 HYDROXY (VIT D DEFICIENCY, FRACTURES): Vit D, 25-Hydroxy: 50.37 ng/mL (ref 30–100)

## 2024-02-07 LAB — FERRITIN: Ferritin: 125 ng/mL (ref 11–307)

## 2024-02-07 MED ORDER — CYANOCOBALAMIN 1000 MCG/ML IJ SOLN
1000.0000 ug | Freq: Once | INTRAMUSCULAR | Status: DC
Start: 2024-02-07 — End: 2024-02-07
  Filled 2024-02-07: qty 1

## 2024-02-07 NOTE — Progress Notes (Signed)
 Candice Ross presents today for B12 injection per the provider's orders.  Stable during administration without incident; injection site WNL; see MAR for injection details.  Patient tolerated procedure well and without incident.  No questions or complaints noted at this time.

## 2024-02-07 NOTE — Patient Instructions (Signed)

## 2024-02-09 LAB — METHYLMALONIC ACID, SERUM: Methylmalonic Acid, Quantitative: 100 nmol/L (ref 0–378)

## 2024-02-14 ENCOUNTER — Inpatient Hospital Stay: Admitting: Oncology

## 2024-02-15 ENCOUNTER — Inpatient Hospital Stay (HOSPITAL_BASED_OUTPATIENT_CLINIC_OR_DEPARTMENT_OTHER): Admitting: Oncology

## 2024-02-15 VITALS — BP 105/69 | HR 74 | Temp 97.9°F | Resp 19 | Wt 150.6 lb

## 2024-02-15 DIAGNOSIS — E538 Deficiency of other specified B group vitamins: Secondary | ICD-10-CM | POA: Diagnosis not present

## 2024-02-15 DIAGNOSIS — Z9884 Bariatric surgery status: Secondary | ICD-10-CM | POA: Diagnosis not present

## 2024-02-15 DIAGNOSIS — D508 Other iron deficiency anemias: Secondary | ICD-10-CM | POA: Diagnosis not present

## 2024-02-15 DIAGNOSIS — D500 Iron deficiency anemia secondary to blood loss (chronic): Secondary | ICD-10-CM | POA: Diagnosis not present

## 2024-02-15 DIAGNOSIS — E559 Vitamin D deficiency, unspecified: Secondary | ICD-10-CM

## 2024-02-15 DIAGNOSIS — D72819 Decreased white blood cell count, unspecified: Secondary | ICD-10-CM

## 2024-02-15 NOTE — Assessment & Plan Note (Addendum)
-   Most recent labs (03/09/2024): Vitamin D  WNL 50.37. - She was previously taking weekly vitamin D  50,000 units, but this upset her stomach - She is taking vitamin D  4000 mcg daily. - Continue Vitamin D  supplements.

## 2024-02-15 NOTE — Assessment & Plan Note (Addendum)
-   Etiology is secondary to malabsorption (history of gastric bypass, chronic PPI therapy) and blood loss (history of anastomotic ulcer, menorrhagia) - EGD (01/13/2014 by Dr. Harvey): Small anastomotic ulcer, mild gastritis, gastric polyps - Colonoscopy (01/13/2014): Redundant colon, moderate-sized external hemorrhoids - She failed to improve on oral iron  and experienced significant constipation. - Last IV iron  with Venofer  300 mg x 3 in January 2024 - No rectal bleeding or melena.  Experienced several months of menorrhagia in early 2024, which resolved after IUD was removed and she was placed on lo-Loestrin - Most recent labs (02/07/2024): Hgb 13.3/MCV 93.4, ferritin 125, iron  saturation 17%.  CMP normal.  Immature platelet fraction was normal at 7.4% - Given the symptomatic with fatigue would recommend 1 400mg   dose IV Venofer  and recheck CBC with iron  panel in 6 months.

## 2024-02-15 NOTE — Progress Notes (Signed)
 [facility] OFFICE PROGRESS NOTE  Patient, No Pcp Per  ASSESSMENT & PLAN:  Assessment & Plan Iron  deficiency anemia due to chronic blood loss - Etiology is secondary to malabsorption (history of gastric bypass, chronic PPI therapy) and blood loss (history of anastomotic ulcer, menorrhagia) - EGD (01/13/2014 by Dr. Harvey): Small anastomotic ulcer, mild gastritis, gastric polyps - Colonoscopy (01/13/2014): Redundant colon, moderate-sized external hemorrhoids - She failed to improve on oral iron  and experienced significant constipation. - Last IV iron  with Venofer  300 mg x 3 in January 2024 - No rectal bleeding or melena.  Experienced several months of menorrhagia in early 2024, which resolved after IUD was removed and she was placed on lo-Loestrin - Most recent labs (02/07/2024): Hgb 13.3/MCV 93.4, ferritin 125, iron  saturation 17%.  CMP normal.  Immature platelet fraction was normal at 7.4% - Given the symptomatic with fatigue would recommend 1 400mg   dose IV Venofer  and recheck CBC with iron  panel in 6 months.   Low serum vitamin B12 - Likely secondary to malabsorption from gastric bypass surgery - Labs from 02/07/24: Vitamin B12 281 with MMA normal at 100. - Unable to tolerate oral supplementation, reports that it upsets her stomach. - Continues B12 injections monthly here in clinic. - Will recheck B12/MMA in 6 months. Vitamin D  insufficiency - Most recent labs (03/09/2024): Vitamin D  WNL 50.37. - She was previously taking weekly vitamin D  50,000 units, but this upset her stomach - She is taking vitamin D  4000 mcg daily. - Continue Vitamin D  supplements. Vitamin D  deficiency  Status post gastric bypass for obesity  Leukopenia, unspecified type     Orders Placed This Encounter  Procedures   Ferritin    Standing Status:   Future    Expected Date:   08/09/2024    Expiration Date:   02/14/2025    Release to patient:   Immediate   Iron  and TIBC    Standing  Status:   Future    Expected Date:   08/09/2024    Expiration Date:   02/14/2025    Release to patient:   Immediate   CBC with Differential/Platelet    Standing Status:   Future    Expected Date:   08/09/2024    Expiration Date:   02/14/2025    Release to patient:   Immediate   Comprehensive metabolic panel    Standing Status:   Future    Expected Date:   08/09/2024    Expiration Date:   02/14/2025   Methylmalonic acid, serum    Standing Status:   Future    Expected Date:   08/09/2024    Expiration Date:   02/14/2025   VITAMIN D  25 Hydroxy (Vit-D Deficiency, Fractures)    Standing Status:   Future    Expected Date:   08/09/2024    Expiration Date:   02/14/2025   Vitamin B12    Standing Status:   Future    Expected Date:   08/09/2024    Expiration Date:   02/14/2025    INTERVAL HISTORY: Patient returns for follow-up for anemia.  Denies any recent hospitalizations, surgeries or changes to baseline health.   Reports that she has been more tired here recently but she also admits to working quite a bit.  States she did feel much better after she received her 2 doses of IV Venofer  last year.  Denies any bright red blood per rectum, melena or hematochezia.  Appetite is 100% energy levels are 25%.  She continues to have menstrual cycles but they are very light after switching from IUD to oral birth control.  We reviewed last CBC , CMP, iron  panel, vitamin B12, vitamin D   SUMMARY OF HEMATOLOGIC HISTORY: Patient is a 53 year old female medical history significant for iron  deficiency, vitamin D  deficiency and B12 deficiency secondary to gastric bypass.  Had intermittent leukopenia over the past few years.  Denies any melena, hematochezia or rectal bleeding but did have heavy menstrual cycles prior to being switched from an IUD to an oral birth control.  Lab Results  Component Value Date   HGB 13.3 02/07/2024   FERRITIN 125 02/07/2024   VITAMINB12 281 02/07/2024    Vitals:   02/15/24 0950   BP: 105/69  Pulse: 74  Resp: 19  Temp: 97.9 F (36.6 C)  SpO2: 97%    Review of Systems  Constitutional:  Positive for malaise/fatigue.  Gastrointestinal:  Negative for blood in stool, constipation, diarrhea and melena.    Physical Exam Constitutional:      Appearance: Normal appearance.  HENT:     Head: Normocephalic and atraumatic.  Eyes:     Pupils: Pupils are equal, round, and reactive to light.  Cardiovascular:     Rate and Rhythm: Normal rate and regular rhythm.     Heart sounds: Normal heart sounds. No murmur heard. Pulmonary:     Effort: Pulmonary effort is normal.     Breath sounds: Normal breath sounds. No wheezing.  Abdominal:     General: Bowel sounds are normal. There is no distension.     Palpations: Abdomen is soft.     Tenderness: There is no abdominal tenderness.  Musculoskeletal:        General: Normal range of motion.     Cervical back: Normal range of motion.  Skin:    General: Skin is warm and dry.     Findings: No rash.  Neurological:     Mental Status: She is alert and oriented to person, place, and time.  Psychiatric:        Judgment: Judgment normal.      I spent 25 minutes dedicated to the care of this patient (face-to-face and non-face-to-face) on the date of the encounter to include what is described in the assessment and plan.,  Delon Hope, NP 02/15/2024 10:06 AM

## 2024-02-15 NOTE — Assessment & Plan Note (Addendum)
-   Likely secondary to malabsorption from gastric bypass surgery - Labs from 02/07/24: Vitamin B12 281 with MMA normal at 100. - Unable to tolerate oral supplementation, reports that it upsets her stomach. - Continues B12 injections monthly here in clinic. - Will recheck B12/MMA in 6 months.

## 2024-02-20 ENCOUNTER — Inpatient Hospital Stay

## 2024-02-20 VITALS — BP 117/83 | HR 73 | Temp 97.3°F | Resp 18 | Ht 64.5 in | Wt 150.4 lb

## 2024-02-20 DIAGNOSIS — D500 Iron deficiency anemia secondary to blood loss (chronic): Secondary | ICD-10-CM

## 2024-02-20 DIAGNOSIS — D508 Other iron deficiency anemias: Secondary | ICD-10-CM | POA: Diagnosis not present

## 2024-02-20 DIAGNOSIS — Z9884 Bariatric surgery status: Secondary | ICD-10-CM

## 2024-02-20 DIAGNOSIS — E538 Deficiency of other specified B group vitamins: Secondary | ICD-10-CM

## 2024-02-20 MED ORDER — METHYLPREDNISOLONE SODIUM SUCC 125 MG IJ SOLR
125.0000 mg | Freq: Once | INTRAMUSCULAR | Status: AC | PRN
Start: 2024-02-20 — End: 2024-02-20
  Administered 2024-02-20: 125 mg via INTRAVENOUS

## 2024-02-20 MED ORDER — ALTEPLASE 2 MG IJ SOLR
2.0000 mg | Freq: Once | INTRAMUSCULAR | Status: DC | PRN
Start: 2024-02-20 — End: 2024-02-20

## 2024-02-20 MED ORDER — HEPARIN SOD (PORK) LOCK FLUSH 100 UNIT/ML IV SOLN: 250.0000 [IU] | Freq: Once | INTRAVENOUS | Status: DC | PRN

## 2024-02-20 MED ORDER — SODIUM CHLORIDE 0.9 % IV SOLN
400.0000 mg | Freq: Once | INTRAVENOUS | Status: AC
  Administered 2024-02-20: 400 mg via INTRAVENOUS
  Filled 2024-02-20: qty 400

## 2024-02-20 MED ORDER — ACETAMINOPHEN 325 MG PO TABS
650.0000 mg | ORAL_TABLET | Freq: Once | ORAL | Status: AC
  Administered 2024-02-20: 650 mg via ORAL
  Filled 2024-02-20: qty 2

## 2024-02-20 MED ORDER — HEPARIN SOD (PORK) LOCK FLUSH 100 UNIT/ML IV SOLN: 500.0000 [IU] | Freq: Once | INTRAVENOUS | Status: DC | PRN

## 2024-02-20 MED ORDER — CETIRIZINE HCL 10 MG PO TABS
10.0000 mg | ORAL_TABLET | Freq: Once | ORAL | Status: AC
  Administered 2024-02-20: 10 mg via ORAL
  Filled 2024-02-20: qty 1

## 2024-02-20 MED ORDER — SODIUM CHLORIDE 0.9% FLUSH
10.0000 mL | Freq: Once | INTRAVENOUS | Status: DC | PRN
Start: 2024-02-20 — End: 2024-02-20

## 2024-02-20 MED ORDER — SODIUM CHLORIDE 0.9% FLUSH: 3.0000 mL | Freq: Once | INTRAVENOUS | Status: DC | PRN

## 2024-02-20 MED ORDER — SODIUM CHLORIDE 0.9 % IV SOLN: INTRAVENOUS | Status: DC

## 2024-02-20 MED ORDER — FAMOTIDINE IN NACL 20-0.9 MG/50ML-% IV SOLN
20.0000 mg | Freq: Once | INTRAVENOUS | Status: AC | PRN
  Administered 2024-02-20: 20 mg via INTRAVENOUS

## 2024-02-20 NOTE — Progress Notes (Signed)
 Per RN Patient just finished her iron  and post wait. Patient has red raised rash to left arm without itching and reports feeling a little lightheaded. Cetirizine  10mg  PO changed to IV and Pepcid  IV 20mg  and Solu-Medrol  IV 125mg  added as premeds for subsequent infusions per [name], PA-C.  Kyelle Urbas, PharmD, MBA

## 2024-02-20 NOTE — Patient Instructions (Signed)
 [facility] [place] - A DEPT OF [facility]  Discharge Instructions: Thank you for choosing [facility] to provide your oncology and hematology care.  If you have a lab appointment with the Cancer Center - please note that after April 8th, 2024, all labs will be drawn in the cancer center.  You do not have to check in or register with the main entrance as you have in the past but will complete your check-in in the cancer center.  Wear comfortable clothing and clothing appropriate for easy access to any Portacath or PICC line.   We strive to give you quality time with your provider. You may need to reschedule your appointment if you arrive late (15 or more minutes).  Arriving late affects you and other patients whose appointments are after yours.  Also, if you miss three or more appointments without notifying the office, you may be dismissed from the clinic at the provider's discretion.      For prescription refill requests, have your pharmacy contact our office and allow 72 hours for refills to be completed.    Today you received Venofer IV iron infusion.     BELOW ARE SYMPTOMS THAT SHOULD BE REPORTED IMMEDIATELY: *FEVER GREATER THAN 100.4 F (38 C) OR HIGHER *CHILLS OR SWEATING *NAUSEA AND VOMITING THAT IS NOT CONTROLLED WITH YOUR NAUSEA MEDICATION *UNUSUAL SHORTNESS OF BREATH *UNUSUAL BRUISING OR BLEEDING *URINARY PROBLEMS (pain or burning when urinating, or frequent urination) *BOWEL PROBLEMS (unusual diarrhea, constipation, pain near the anus) TENDERNESS IN MOUTH AND THROAT WITH OR WITHOUT PRESENCE OF ULCERS (sore throat, sores in mouth, or a toothache) UNUSUAL RASH, SWELLING OR PAIN  UNUSUAL VAGINAL DISCHARGE OR ITCHING   Items with * indicate a potential emergency and should be followed up as soon as possible or go to the Emergency Department if any problems should occur.  Please show the CHEMOTHERAPY ALERT CARD or IMMUNOTHERAPY ALERT CARD at  check-in to the Emergency Department and triage nurse.  Should you have questions after your visit or need to cancel or reschedule your appointment, please contact [facility]  - A DEPT OF Eligha Bridegroom [facility] [phone]  and follow the prompts.  Office hours are 8:00 a.m. to 4:30 p.m. Monday - Friday. Please note that voicemails left after 4:00 p.m. may not be returned until the following business day.  We are closed weekends and major holidays. You have access to a nurse at all times for urgent questions. Please call the main number to the clinic [phone] and follow the prompts.  For any non-urgent questions, you may also contact your provider using MyChart. We now offer e-Visits for anyone 13 and older to request care online for non-urgent symptoms. For details visit [email or web address].   Also download the MyChart app! Go to the app store, search "MyChart", open the app, select [place], and log in with your MyChart username and password.

## 2024-02-20 NOTE — Progress Notes (Signed)
 Hypersensitivity Reaction note  Date of event: 02/20/24 Time of event: 1300 Generic name of drug involved: Iron  Sucrose 400 mg  Name of provider notified of the hypersensitivity reaction: Rebekah Pennington-PA-C. Red raised rash on patient left forearm with no itching and lightheadedness.  Was agent that likely caused hypersensitivity reaction added to Allergies List within EMR? Yes  Chain of events including reaction signs/symptoms, treatment administered, and outcome (e.g., drug resumed; drug discontinued; sent to Emergency Department; etc.) After iron  and 30 post wait time was finished, patient c/o red raised rash with no itching and some lightheadedness. Vitals obtained see flowsheet. Rebekah Pennington-PA-C at bedside to assess patient. Provider stated to give Pepcid  20 mg IV and Solu-medrol  125 mg/2mL and evaluate patient for 30 minutes after Pepcid  is complete. Additional pre-meds will be added to future iron  infusions. After 30 minute evaluate was completed, a rash started to develop on patient's other arm. R.Pennington-PA-C made aware and stated to evaluate patient for another 30 minutes to be sure no other symptoms arise while patient was in the clinic. After the additional 30 minutes evaluation was completed. Patient voiced no new symptoms and rash has started to resolve on both arms. Patient discharged in stable condition with no complaints at this time. Patient advised to report to the ER if symptoms get worse per provider. Patient made aware and verbalized understanding.   Maryjo CHRISTELLA Kleine, RN 02/20/2024 2:36 PM

## 2024-02-20 NOTE — Progress Notes (Signed)
 Patient presents today for iron  infusion.  Patient is in satisfactory condition with no new complaints voiced.  Vital signs are stable.  We will proceed with infusion per provider orders.    Peripheral IV started with good blood return pre and post infusion.   After iron  and 30 post wait time was finished, patient c/o red raised rash with no itching and some lightheadedness. Vitals obtained see flowsheet. Rebekah Pennington-PA-C at bedside to assess patient. Provider stated to give Pepcid  20 mg IV and Solu-medrol  125 mg/2mL and evaluate patient for 30 minutes after Pepcid  is complete. Additional pre-meds will be added to future iron  infusions. After 30 minute evaluate was completed, a rash started to develop on patient's other arm. R.Pennington-PA-C made aware and stated to evaluate patient for another 30 minutes to be sure no other symptoms arise while patient was in the clinic. After the additional 30 minutes evaluation was completed. Patient voiced no new symptoms and rash has started to resolve on both arms. Patient discharged in stable condition with no complaints at this time. Patient advised to report to the ER if symptoms get worse per provider. Patient made aware and verbalized understanding.   Discharged from clinic ambulatory in stable condition. Alert and oriented x 3. F/U with [facility] as scheduled.

## 2024-02-20 NOTE — Progress Notes (Addendum)
 Notified by RN that approximately 30 minutes after infusion completed, patient had raised nonpruritic rash of left arm (same arm as IV).  Patient also complained of lightheadedness.  No swelling, chest pain, or difficulty breathing.  No wheezing on exam.  Vital signs stable.  Patient evaluated at bedside by me.  No acute distress.  Orders given for patient to receive IV Pepcid  20 mg and IV Solu-Medrol  125 mg.  We will add premedications for future Venofer  to include IV cetirizine , IV Pepcid , IV Solu-Medrol , and p.o. acetaminophen .  Instructions given to RN that if symptoms are improved after another 30 minutes, patient can discharge home.  Patient given instructions regarding alarm symptoms that would prompt return visit and/or emergency medical evaluation.  Pleasant [name], PA-C 02/20/24 1:14 PM

## 2024-03-11 ENCOUNTER — Inpatient Hospital Stay: Attending: Oncology

## 2024-03-11 VITALS — BP 112/73 | HR 81 | Temp 97.9°F | Resp 18

## 2024-03-11 DIAGNOSIS — Z9884 Bariatric surgery status: Secondary | ICD-10-CM

## 2024-03-11 DIAGNOSIS — D500 Iron deficiency anemia secondary to blood loss (chronic): Secondary | ICD-10-CM

## 2024-03-11 DIAGNOSIS — E538 Deficiency of other specified B group vitamins: Secondary | ICD-10-CM | POA: Insufficient documentation

## 2024-03-11 MED ORDER — CYANOCOBALAMIN 1000 MCG/ML IJ SOLN
1000.0000 ug | Freq: Once | INTRAMUSCULAR | Status: AC
  Administered 2024-03-11: 1000 ug via INTRAMUSCULAR
  Filled 2024-03-11: qty 1

## 2024-03-11 NOTE — Patient Instructions (Signed)
 Vitamin B12 Injection What is this medication? Vitamin B12 (VAHY tuh min B12) prevents and treats low vitamin B12 levels in your body. It is used in people who do not get enough vitamin B12 from their diet or when their digestive tract does not absorb enough. Vitamin B12 plays an important role in maintaining the health of your nervous system and red blood cells. This medicine may be used for other purposes; ask your health care provider or pharmacist if you have questions. COMMON BRAND NAME(S): B-12 Compliance Kit, B-12 Injection Kit, Cyomin, Dodex , LA-12, Nutri-Twelve, Physicians EZ Use B-12, Primabalt, Vitamin Deficiency Injectable System - B12 What should I tell my care team before I take this medication? They need to know if you have any of these conditions: Kidney disease Leber's disease Megaloblastic anemia An unusual or allergic reaction to cyanocobalamin , cobalt, other medications, foods, dyes, or preservatives Pregnant or trying to get pregnant Breast-feeding How should I use this medication? This medication is injected into a muscle or deeply under the skin. It is usually given in a clinic or care team's office. However, your care team may teach you how to inject yourself. Follow all instructions. Talk to your care team about the use of this medication in children. Special care may be needed. Overdosage: If you think you have taken too much of this medicine contact a poison control center or emergency room at once. NOTE: This medicine is only for you. Do not share this medicine with others. What if I miss a dose? If you are given your dose at a clinic or care team's office, call to reschedule your appointment. If you give your own injections, and you miss a dose, take it as soon as you can. If it is almost time for your next dose, take only that dose. Do not take double or extra doses. What may interact with this medication? Alcohol Colchicine This list may not describe all possible  interactions. Give your health care provider a list of all the medicines, herbs, non-prescription drugs, or dietary supplements you use. Also tell them if you smoke, drink alcohol, or use illegal drugs. Some items may interact with your medicine. What should I watch for while using this medication? Visit your care team regularly. You may need blood work done while you are taking this medication. You may need to follow a special diet. Talk to your care team. Limit your alcohol intake and avoid smoking to get the best benefit. What side effects may I notice from receiving this medication? Side effects that you should report to your care team as soon as possible: Allergic reactions--skin rash, itching, hives, swelling of the face, lips, tongue, or throat Swelling of the ankles, hands, or feet Trouble breathing Side effects that usually do not require medical attention (report to your care team if they continue or are bothersome): Diarrhea This list may not describe all possible side effects. Call your doctor for medical advice about side effects. You may report side effects to FDA at 1-800-FDA-1088. Where should I keep my medication? Keep out of the reach of children. Store at room temperature between 15 and 30 degrees C (59 and 85 degrees F). Protect from light. Throw away any unused medication after the expiration date. NOTE: This sheet is a summary. It may not cover all possible information. If you have questions about this medicine, talk to your doctor, pharmacist, or health care provider.  2024 Elsevier/Gold Standard (2021-03-23 00:00:00)

## 2024-03-11 NOTE — Progress Notes (Signed)
 Patient tolerated B12 injection with no complaints voiced.  Site clean and dry with no bruising or swelling noted at site.  See MAR for details.  Band aid applied.  Patient stable during and after injection.  Vss with discharge and left in satisfactory condition with no s/s of distress noted. All follow ups as scheduled.   Candice Ross

## 2024-04-11 ENCOUNTER — Ambulatory Visit

## 2024-04-12 ENCOUNTER — Inpatient Hospital Stay: Attending: Oncology

## 2024-04-12 VITALS — BP 117/87 | HR 77 | Resp 18

## 2024-04-12 DIAGNOSIS — E538 Deficiency of other specified B group vitamins: Secondary | ICD-10-CM | POA: Insufficient documentation

## 2024-04-12 DIAGNOSIS — Z9884 Bariatric surgery status: Secondary | ICD-10-CM

## 2024-04-12 DIAGNOSIS — D500 Iron deficiency anemia secondary to blood loss (chronic): Secondary | ICD-10-CM

## 2024-04-12 MED ORDER — CYANOCOBALAMIN 1000 MCG/ML IJ SOLN
1000.0000 ug | Freq: Once | INTRAMUSCULAR | Status: AC
  Administered 2024-04-12: 1000 ug via INTRAMUSCULAR
  Filled 2024-04-12: qty 1

## 2024-04-12 NOTE — Patient Instructions (Signed)
 [facility] [place] - A DEPT OF [facility]  Discharge Instructions: Thank you for choosing [facility] to provide your oncology and hematology care.  If you have a lab appointment with the Cancer Center - please note that after April 8th, 2024, all labs will be drawn in the cancer center.  You do not have to check in or register with the main entrance as you have in the past but will complete your check-in in the cancer center.  Wear comfortable clothing and clothing appropriate for easy access to any Portacath or PICC line.   We strive to give you quality time with your provider. You may need to reschedule your appointment if you arrive late (15 or more minutes).  Arriving late affects you and other patients whose appointments are after yours.  Also, if you miss three or more appointments without notifying the office, you may be dismissed from the clinic at the provider's discretion.      For prescription refill requests, have your pharmacy contact our office and allow 72 hours for refills to be completed.    Today you received the following B12 injection, return as scheduled.   To help prevent nausea and vomiting after your treatment, we encourage you to take your nausea medication as directed.  BELOW ARE SYMPTOMS THAT SHOULD BE REPORTED IMMEDIATELY: *FEVER GREATER THAN 100.4 F (38 C) OR HIGHER *CHILLS OR SWEATING *NAUSEA AND VOMITING THAT IS NOT CONTROLLED WITH YOUR NAUSEA MEDICATION *UNUSUAL SHORTNESS OF BREATH *UNUSUAL BRUISING OR BLEEDING *URINARY PROBLEMS (pain or burning when urinating, or frequent urination) *BOWEL PROBLEMS (unusual diarrhea, constipation, pain near the anus) TENDERNESS IN MOUTH AND THROAT WITH OR WITHOUT PRESENCE OF ULCERS (sore throat, sores in mouth, or a toothache) UNUSUAL RASH, SWELLING OR PAIN  UNUSUAL VAGINAL DISCHARGE OR ITCHING   Items with * indicate a potential emergency and should be followed up as soon as  possible or go to the Emergency Department if any problems should occur.  Please show the CHEMOTHERAPY ALERT CARD or IMMUNOTHERAPY ALERT CARD at check-in to the Emergency Department and triage nurse.  Should you have questions after your visit or need to cancel or reschedule your appointment, please contact [facility] [place] - A DEPT OF [facility] [phone]  and follow the prompts.  Office hours are 8:00 a.m. to 4:30 p.m. Monday - Friday. Please note that voicemails left after 4:00 p.m. may not be returned until the following business day.  We are closed weekends and major holidays. You have access to a nurse at all times for urgent questions. Please call the main number to the clinic [phone] and follow the prompts.  For any non-urgent questions, you may also contact your provider using MyChart. We now offer e-Visits for anyone 69 and older to request care online for non-urgent symptoms. For details visit [email or web address].   Also download the MyChart app! Go to the app store, search "MyChart", open the app, select [place], and log in with your MyChart username and password.

## 2024-04-12 NOTE — Progress Notes (Signed)
 Patient tolerated injection with no complaints voiced. Site clean and dry with no bruising or swelling noted at site. See MAR for details. Band aid applied.  Patient stable during and after injection. VSS with discharge and left in satisfactory condition with no s/s of distress noted.

## 2024-05-13 ENCOUNTER — Inpatient Hospital Stay: Attending: Oncology

## 2024-05-13 ENCOUNTER — Ambulatory Visit

## 2024-05-13 VITALS — BP 116/85 | HR 70 | Temp 97.9°F | Resp 18

## 2024-05-13 DIAGNOSIS — E538 Deficiency of other specified B group vitamins: Secondary | ICD-10-CM | POA: Insufficient documentation

## 2024-05-13 DIAGNOSIS — Z9884 Bariatric surgery status: Secondary | ICD-10-CM

## 2024-05-13 DIAGNOSIS — D500 Iron deficiency anemia secondary to blood loss (chronic): Secondary | ICD-10-CM

## 2024-05-13 MED ORDER — CYANOCOBALAMIN 1000 MCG/ML IJ SOLN
1000.0000 ug | Freq: Once | INTRAMUSCULAR | Status: AC
  Administered 2024-05-13: 1000 ug via INTRAMUSCULAR
  Filled 2024-05-13: qty 1

## 2024-05-13 NOTE — Progress Notes (Signed)
 Candice Ross presents today for B12 injection per the provider's orders.  Stable duirng administration without incident; injection site WNL; see MAR for injection details.  Patient tolerated procedure well and without incident.  No questions or complaints noted at this time.

## 2024-06-13 ENCOUNTER — Inpatient Hospital Stay: Attending: Oncology

## 2024-07-15 ENCOUNTER — Ambulatory Visit

## 2024-07-22 ENCOUNTER — Encounter: Payer: Self-pay | Admitting: *Deleted

## 2024-08-15 ENCOUNTER — Inpatient Hospital Stay: Attending: Oncology

## 2024-09-10 ENCOUNTER — Inpatient Hospital Stay

## 2024-09-17 ENCOUNTER — Inpatient Hospital Stay

## 2024-09-17 ENCOUNTER — Inpatient Hospital Stay: Admitting: Oncology
